# Patient Record
Sex: Male | Born: 1975 | Race: White | Hispanic: No | Marital: Married | Smoking: Former smoker
Health system: Southern US, Community
[De-identification: ages and names within clinical notes are randomized; demographics above are authoritative.]

## PROBLEM LIST (undated history)

## (undated) DIAGNOSIS — E039 Hypothyroidism, unspecified: Secondary | ICD-10-CM

## (undated) DIAGNOSIS — C01 Malignant neoplasm of base of tongue: Secondary | ICD-10-CM

## (undated) DIAGNOSIS — I82409 Acute embolism and thrombosis of unspecified deep veins of unspecified lower extremity: Secondary | ICD-10-CM

## (undated) DIAGNOSIS — E781 Pure hyperglyceridemia: Secondary | ICD-10-CM

## (undated) DIAGNOSIS — E291 Testicular hypofunction: Secondary | ICD-10-CM

## (undated) DIAGNOSIS — F411 Generalized anxiety disorder: Secondary | ICD-10-CM

## (undated) HISTORY — DX: Hypothyroidism, unspecified: E03.9

## (undated) HISTORY — PX: TONSILLECTOMY: SUR1361

## (undated) HISTORY — PX: LYMPH NODE DISSECTION: SHX5087

## (undated) HISTORY — DX: Malignant neoplasm of base of tongue: C01

## (undated) HISTORY — DX: Pure hyperglyceridemia: E78.1

## (undated) HISTORY — DX: Testicular hypofunction: E29.1

## (undated) HISTORY — DX: Acute embolism and thrombosis of unspecified deep veins of unspecified lower extremity: I82.409

---
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
## 2014-03-05 ENCOUNTER — Encounter: Payer: Self-pay | Admitting: Family Medicine

## 2014-03-05 ENCOUNTER — Ambulatory Visit (INDEPENDENT_AMBULATORY_CARE_PROVIDER_SITE_OTHER): Payer: 59 | Admitting: Family Medicine

## 2014-03-05 VITALS — BP 136/85 | HR 79 | Ht 74.5 in | Wt 218.0 lb

## 2014-03-05 DIAGNOSIS — Z13220 Encounter for screening for lipoid disorders: Secondary | ICD-10-CM

## 2014-03-05 DIAGNOSIS — Z23 Encounter for immunization: Secondary | ICD-10-CM

## 2014-03-05 DIAGNOSIS — Z131 Encounter for screening for diabetes mellitus: Secondary | ICD-10-CM

## 2014-03-05 DIAGNOSIS — R5381 Other malaise: Secondary | ICD-10-CM

## 2014-03-05 DIAGNOSIS — R5383 Other fatigue: Secondary | ICD-10-CM | POA: Insufficient documentation

## 2014-03-05 DIAGNOSIS — E781 Pure hyperglyceridemia: Secondary | ICD-10-CM

## 2014-03-05 DIAGNOSIS — R5382 Chronic fatigue, unspecified: Secondary | ICD-10-CM

## 2014-03-05 DIAGNOSIS — Z0000 Encounter for general adult medical examination without abnormal findings: Secondary | ICD-10-CM

## 2014-03-05 HISTORY — DX: Pure hyperglyceridemia: E78.1

## 2014-03-05 LAB — COMPLETE METABOLIC PANEL WITH GFR
ALK PHOS: 79 U/L (ref 39–117)
ALT: 60 U/L — ABNORMAL HIGH (ref 0–53)
AST: 104 U/L — ABNORMAL HIGH (ref 0–37)
Albumin: 4.4 g/dL (ref 3.5–5.2)
BUN: 15 mg/dL (ref 6–23)
CO2: 25 mEq/L (ref 19–32)
CREATININE: 1.42 mg/dL — AB (ref 0.50–1.35)
Calcium: 9.3 mg/dL (ref 8.4–10.5)
Chloride: 100 mEq/L (ref 96–112)
GFR, EST NON AFRICAN AMERICAN: 63 mL/min
GFR, Est African American: 72 mL/min
Glucose, Bld: 94 mg/dL (ref 70–99)
Potassium: 4 mEq/L (ref 3.5–5.3)
SODIUM: 137 meq/L (ref 135–145)
TOTAL PROTEIN: 7.1 g/dL (ref 6.0–8.3)
Total Bilirubin: 1 mg/dL (ref 0.2–1.2)

## 2014-03-05 LAB — LIPID PANEL
CHOL/HDL RATIO: 4.9 ratio
CHOLESTEROL: 202 mg/dL — AB (ref 0–200)
HDL: 41 mg/dL (ref >39)
LDL Cholesterol: 122 mg/dL — ABNORMAL HIGH (ref 0–99)
TRIGLYCERIDES: 194 mg/dL — AB (ref <150)
VLDL: 39 mg/dL (ref 0–40)

## 2014-03-05 LAB — BASIC METABOLIC PANEL: CREATININE: 1.4 — AB (ref 0.6–1.3)

## 2014-03-05 NOTE — Progress Notes (Signed)
CC: Leonard Jackson is a 38 y.o. male is here for Establish Care   Subjective: HPI:  Colonoscopy: No family history of colon cancer will begin screening at age 40 Prostate: Discussed screening risks/beneifts with patient during today's visit no family history of prostate cancer will begin screening at age 39  Influenza Vaccine: To receive this today Pneumovax: No current indication Td/Tdap:  Will receive Tdap today Zoster: (Start 38 yo)  Pleasant 38 year old here to establish care with the only acute complaint of fatigue.  No tobacco use nor rec drug use.  Rare alcohol use.  Review of Systems - General ROS: negative for - chills, fever, night sweats, weight gain or weight loss Ophthalmic ROS: negative for - decreased vision Psychological ROS: negative for - anxiety or depression ENT ROS: negative for - hearing change, nasal congestion, tinnitus or allergies Hematological and Lymphatic ROS: negative for - bleeding problems, bruising or swollen lymph nodes Breast ROS: negative Respiratory ROS: no cough, shortness of breath, or wheezing Cardiovascular ROS: no chest pain or dyspnea on exertion Gastrointestinal ROS: no abdominal pain, change in bowel habits, or black or bloody stools Genito-Urinary ROS: negative for - genital discharge, genital ulcers, incontinence or abnormal bleeding from genitals Musculoskeletal ROS: negative for - joint pain or muscle pain Neurological ROS: negative for - headaches or memory loss Dermatological ROS: negative for lumps, mole changes, rash and skin lesion changes  Past Medical History  Diagnosis Date  . Hypertriglyceridemia 03/05/2014    No past surgical history on file. No family history on file.  History   Social History  . Marital Status: Married    Spouse Name: N/A    Number of Children: N/A  . Years of Education: N/A   Occupational History  . Not on file.   Social History Main Topics  . Smoking status: Former Smoker    Quit date:  05/12/2012  . Smokeless tobacco: Not on file  . Alcohol Use: 3.0 oz/week    6 drink(s) per week  . Drug Use: No  . Sexual Activity: Yes    Partners: Female   Other Topics Concern  . Not on file   Social History Narrative  . No narrative on file     Objective: BP 136/85  Pulse 79  Ht 6' 2.5" (1.892 m)  Wt 218 lb (98.884 kg)  BMI 27.62 kg/m2  General: No Acute Distress HEENT: Atraumatic, normocephalic, conjunctivae normal without scleral icterus.  No nasal discharge, hearing grossly intact, TMs with good landmarks bilaterally with no middle ear abnormalities, posterior pharynx clear without oral lesions. Neck: Supple, trachea midline, no cervical nor supraclavicular adenopathy. Pulmonary: Clear to auscultation bilaterally without wheezing, rhonchi, nor rales. Cardiac: Regular rate and rhythm.  No murmurs, rubs, nor gallops. No peripheral edema.  2+ peripheral pulses bilaterally. Abdomen: Bowel sounds normal.  No masses.  Non-tender without rebound.  Negative Murphy's sign. MSK: Grossly intact, no signs of weakness.  Full strength throughout upper and lower extremities.  Full ROM in upper and lower extremities.  No midline spinal tenderness. Neuro: Gait unremarkable, CN II-XII grossly intact.  C5-C6 Reflex 2/4 Bilaterally, L4 Reflex 2/4 Bilaterally.  Cerebellar function intact. Skin: No rashes. 1 noninflamed skin tag on the back in the lumbar region Psych: Alert and oriented to person/place/time.  Thought process normal. No anxiety/depression.  Assessment & Plan: Leonard Jackson was seen today for establish care.  Diagnoses and associated orders for this visit:  Annual physical exam - Testosterone, free, total - COMPLETE METABOLIC  PANEL WITH GFR - Lipid panel  Lipid screening - Lipid panel  Diabetes mellitus screening - COMPLETE METABOLIC PANEL WITH GFR  Hypertriglyceridemia  Chronic fatigue    Healthy lifestyle interventions including but not limited to regular exercise, a  healthy low fat diet, moderation of salt intake, the dangers of tobacco/alcohol/recreational drug use, nutrition supplementation, and accident avoidance were discussed with the patient and a handout was provided for future reference. Due for routine dyslipidemia and diabetic screening Discussed typical fatigue workup he would prefer to only check testosterone at this time  Return if symptoms worsen or fail to improve.

## 2014-03-05 NOTE — Patient Instructions (Signed)
Dr. Hommel's General Advice Following Your Complete Physical Exam  The Benefits of Regular Exercise: Unless you suffer from an uncontrolled cardiovascular condition, studies strongly suggest that regular exercise and physical activity will add to both the quality and length of your life.  The World Health Organization recommends 150 minutes of moderate intensity aerobic activity every week.  This is best split over 3-4 days a week, and can be as simple as a brisk walk for just over 35 minutes "most days of the week".  This type of exercise has been shown to lower LDL-Cholesterol, lower average blood sugars, lower blood pressure, lower cardiovascular disease risk, improve memory, and increase one's overall sense of wellbeing.  The addition of anaerobic (or "strength training") exercises offers additional benefits including but not limited to increased metabolism, prevention of osteoporosis, and improved overall cholesterol levels.  How Can I Strive For A Low-Fat Diet?: Current guidelines recommend that 25-35 percent of your daily energy (food) intake should come from fats.  One might ask how can this be achieved without having to dissect each meal on a daily basis?  Switch to skim or 1% milk instead of whole milk.  Focus on lean meats such as ground turkey, fresh fish, baked chicken, and lean cuts of beef as your source of dietary protein.  Limit saturated fat consumption to less than 10% of your daily caloric intake.  Limit trans fatty acid consumption primarily by limiting synthetic trans fats such as partially hydrogenated oils (Ex: fried fast foods).  Substitute olive or vegetable oil for solid fats where possible.  Moderation of Salt Intake: Provided you don't carry a diagnosis of congestive heart failure nor renal failure, I recommend a daily allowance of no more than 2300 mg of salt (sodium).  Keeping under this daily goal is associated with a decreased risk of cardiovascular events, creeping  above it can lead to elevated blood pressures and increases your risk of cardiovascular events.  Milligrams (mg) of salt is listed on all nutrition labels, and your daily intake can add up faster than you think.  Most canned and frozen dinners can pack in over half your daily salt allowance in one meal.    Lifestyle Health Risks: Certain lifestyle choices carry specific health risks.  As you may already know, tobacco use has been associated with increasing one's risk of cardiovascular disease, pulmonary disease, numerous cancers, among many other issues.  What you may not know is that there are medications and nicotine replacement strategies that can more than double your chances of successfully quitting.  I would be thrilled to help manage your quitting strategy if you currently use tobacco products.  When it comes to alcohol use, I've yet to find an "ideal" daily allowance.  Provided an individual does not have a medical condition that is exacerbated by alcohol consumption, general guidelines determine "safe drinking" as no more than two standard drinks for a man or no more than one standard drink for a male per day.  However, much debate still exists on whether any amount of alcohol consumption is technically "safe".  My general advice, keep alcohol consumption to a minimum for general health promotion.  If you or others believe that alcohol, tobacco, or recreational drug use is interfering with your life, I would be happy to provide confidential counseling regarding treatment options.  General "Over The Counter" Nutrition Advice: Postmenopausal women should aim for a daily calcium intake of 1200 mg, however a significant portion of this might already be   provided by diets including milk, yogurt, cheese, and other dairy products.  Vitamin D has been shown to help preserve bone density, prevent fatigue, and has even been shown to help reduce falls in the elderly.  Ensuring a daily intake of 800 Units of  Vitamin D is a good place to start to enjoy the above benefits, we can easily check your Vitamin D level to see if you'd potentially benefit from supplementation beyond 800 Units a day.  Folic Acid intake should be of particular concern to women of childbearing age.  Daily consumption of 400-800 mcg of Folic Acid is recommended to minimize the chance of spinal cord defects in a fetus should pregnancy occur.    For many adults, accidents still remain one of the most common culprits when it comes to cause of death.  Some of the simplest but most effective preventitive habits you can adopt include regular seatbelt use, proper helmet use, securing firearms, and regularly testing your smoke and carbon monoxide detectors.  Sean B. Hommel DO [facility] [address], [address] [place], [place] [address] Phone: [phone]  

## 2014-03-06 ENCOUNTER — Telehealth: Payer: Self-pay | Admitting: *Deleted

## 2014-03-06 ENCOUNTER — Encounter: Payer: Self-pay | Admitting: Family Medicine

## 2014-03-06 DIAGNOSIS — E291 Testicular hypofunction: Secondary | ICD-10-CM

## 2014-03-06 DIAGNOSIS — R945 Abnormal results of liver function studies: Secondary | ICD-10-CM

## 2014-03-06 DIAGNOSIS — R7989 Other specified abnormal findings of blood chemistry: Secondary | ICD-10-CM | POA: Insufficient documentation

## 2014-03-06 HISTORY — DX: Testicular hypofunction: E29.1

## 2014-03-06 LAB — TESTOSTERONE, FREE, TOTAL, SHBG
SEX HORMONE BINDING: 22 nmol/L (ref 13–71)
TESTOSTERONE-% FREE: 2.4 % (ref 1.6–2.9)
Testosterone, Free: 70.2 pg/mL (ref 47.0–244.0)
Testosterone: 289 ng/dL — ABNORMAL LOW (ref 300–890)

## 2014-03-06 NOTE — Telephone Encounter (Signed)
Labs entered.

## 2015-03-24 ENCOUNTER — Encounter: Payer: Self-pay | Admitting: Family Medicine

## 2015-03-24 ENCOUNTER — Ambulatory Visit (INDEPENDENT_AMBULATORY_CARE_PROVIDER_SITE_OTHER): Payer: 59 | Admitting: Family Medicine

## 2015-03-24 VITALS — BP 134/87 | HR 77 | Wt 217.0 lb

## 2015-03-24 DIAGNOSIS — E785 Hyperlipidemia, unspecified: Secondary | ICD-10-CM | POA: Diagnosis not present

## 2015-03-24 DIAGNOSIS — E291 Testicular hypofunction: Secondary | ICD-10-CM

## 2015-03-24 MED ORDER — ATORVASTATIN CALCIUM 10 MG PO TABS: 10.0000 mg | ORAL_TABLET | Freq: Every day | ORAL | Status: DC

## 2015-03-24 NOTE — Progress Notes (Signed)
CC: Leonard Jackson is a 39 y.o. male is here for discuss chol   Subjective: HPI:  Last year he was found to have a low testosterone level. He's been doing some thinking and wants to know if he can start supplementation. He reports fatigue on most days of the week. Occasional erectile dysfunction. No other genitourinary complaints.  He's been having his cholesterol checked at work. This year his total cholesterol was 261, HDL of 46, LDL of 173, triglycerides of 209. He exercises most days of the week and thinks there may be some room for improvement with diet. He has no known cardiovascular disease. There's been no chest pain limb claudication or right upper quadrant pain. He had a neighbor who had a heart attack recently and he scared that he might need to have better cholesterol control.  Review Of Systems Outlined In HPI  Past Medical History  Diagnosis Date  . Hypertriglyceridemia 03/05/2014    No past surgical history on file. No family history on file.  Social History   Social History  . Marital Status: Married    Spouse Name: N/A  . Number of Children: N/A  . Years of Education: N/A   Occupational History  . Not on file.   Social History Main Topics  . Smoking status: Former Smoker    Quit date: 05/12/2012  . Smokeless tobacco: Not on file  . Alcohol Use: 3.0 oz/week    6 drink(s) per week  . Drug Use: No  . Sexual Activity:    Partners: Female   Other Topics Concern  . Not on file   Social History Narrative     Objective: BP 134/87 mmHg  Pulse 77  Wt 217 lb (98.431 kg)  Vital signs reviewed. General: Alert and Oriented, No Acute Distress HEENT: Pupils equal, round, reactive to light. Conjunctivae clear.  External ears unremarkable.  Moist mucous membranes. Lungs: Clear and comfortable work of breathing, speaking in full sentences without accessory muscle use. Cardiac: Regular rate and rhythm.  Neuro: CN II-XII grossly intact, gait normal. Extremities: No  peripheral edema.  Strong peripheral pulses.  Mental Status: No depression, anxiety, nor agitation. Logical though process. Skin: Warm and dry.  Assessment & Plan: Leonard Jackson was seen today for discuss chol.  Diagnoses and all orders for this visit:  Hypogonadism male -     PSA -     Hemoglobin -     Testosterone  Hyperlipidemia  Other orders -     atorvastatin (LIPITOR) 10 MG tablet; Take 1 tablet (10 mg total) by mouth daily.   Hypogonadism: Rechecking PSA hemoglobin and testosterone. If his testosterone level is still low and the other lab work is normal I prepared him that he can consider starting topical or injectable replacement therapy. Hyperlipidemia: chronic uncontrolled condition. Advised to at least start fish oil 1 g twice a day and minimize cholesterol in the diet. I also offered him that if he wanted to be aggressive he could start on a low dose of Lipitor. I also let him know that for his age we cannot give him an accurate 10 year risk prediction from the American Heart Association calculator.he would like to be aggressive and start on atorvastatin if it proves to be helpful with numbers without side effects.  Return in about 2 months (around 05/24/2015) for Cholesterol Recheck.

## 2015-03-25 LAB — TESTOSTERONE: TESTOSTERONE: 289 ng/dL — AB (ref 300–890)

## 2015-03-25 LAB — HEMOGLOBIN: Hemoglobin: 16.1 g/dL (ref 13.0–17.0)

## 2015-03-25 LAB — PSA: PSA: 0.65 ng/mL (ref <=4.00)

## 2015-03-26 ENCOUNTER — Telehealth: Payer: Self-pay | Admitting: Family Medicine

## 2015-03-26 MED ORDER — TESTOSTERONE 12.5 MG/ACT (1%) TD GEL: TRANSDERMAL | Status: DC

## 2015-03-26 NOTE — Telephone Encounter (Signed)
Leonard Jackson, Rx placed in in-box ready for pickup/faxing.  

## 2015-03-26 NOTE — Telephone Encounter (Signed)
rx faxed

## 2015-03-27 ENCOUNTER — Telehealth: Payer: Self-pay | Admitting: *Deleted

## 2015-03-27 NOTE — Telephone Encounter (Signed)
Pt's insurance will only cover axiron or androderm. Pharm wants to know if you can write a rx for either of the two

## 2015-03-31 MED ORDER — TESTOSTERONE 4 MG/24HR TD PT24: 1.0000 | MEDICATED_PATCH | Freq: Every day | TRANSDERMAL | Status: DC

## 2015-03-31 NOTE — Telephone Encounter (Signed)
Pt.notified

## 2015-03-31 NOTE — Telephone Encounter (Signed)
Seth Bake, New Rx for androderm in your in box.  Please ask patient to f/u with me in 2-3 months.

## 2015-04-03 ENCOUNTER — Telehealth: Payer: Self-pay | Admitting: Family Medicine

## 2015-04-03 NOTE — Telephone Encounter (Signed)
Received fax for prior authorization on Androderm sent through cover my meds waiting on authorization - CF

## 2015-04-14 NOTE — Telephone Encounter (Signed)
Resent authorization through cover my meds this time with the clinical questions waiting on authorization. - CF

## 2015-04-17 NOTE — Telephone Encounter (Signed)
Received authorization for Testosterone patch.  PA#: [employer] [phone] DD. Valid: 04/15/15-04/14/16.  Pharmacy notified, they will contact Pt when Rx is ready to be picked up.

## 2015-05-04 ENCOUNTER — Ambulatory Visit (INDEPENDENT_AMBULATORY_CARE_PROVIDER_SITE_OTHER): Payer: 59 | Admitting: Family Medicine

## 2015-05-04 ENCOUNTER — Ambulatory Visit (INDEPENDENT_AMBULATORY_CARE_PROVIDER_SITE_OTHER): Payer: 59

## 2015-05-04 ENCOUNTER — Encounter: Payer: Self-pay | Admitting: Family Medicine

## 2015-05-04 VITALS — BP 138/90 | HR 77 | Temp 98.4°F | Wt 223.0 lb

## 2015-05-04 DIAGNOSIS — J209 Acute bronchitis, unspecified: Secondary | ICD-10-CM | POA: Diagnosis not present

## 2015-05-04 MED ORDER — PREDNISONE 10 MG PO TABS: 30.0000 mg | ORAL_TABLET | Freq: Every day | ORAL | Status: DC

## 2015-05-04 MED ORDER — GUAIFENESIN-CODEINE 100-10 MG/5ML PO SOLN: 5.0000 mL | Freq: Every evening | ORAL | Status: DC | PRN

## 2015-05-04 MED ORDER — IPRATROPIUM-ALBUTEROL 0.5-2.5 (3) MG/3ML IN SOLN
3.0000 mL | Freq: Once | RESPIRATORY_TRACT | Status: AC
  Administered 2015-05-04: 3 mL via RESPIRATORY_TRACT

## 2015-05-04 MED ORDER — IPRATROPIUM BROMIDE 0.06 % NA SOLN: 2.0000 | Freq: Four times a day (QID) | NASAL | Status: DC

## 2015-05-04 NOTE — Assessment & Plan Note (Signed)
Likely bronchitis. Treat with prednisone and Atrovent nasal spray and codeine cough syrup. Chest x-ray pending.

## 2015-05-04 NOTE — Patient Instructions (Signed)
Thank you for coming in today. Call or go to the emergency room if you get worse, have trouble breathing, have chest pains, or palpitations.     Acute Bronchitis Bronchitis is inflammation of the airways that extend from the windpipe into the lungs (bronchi). The inflammation often causes mucus to develop. This leads to a cough, which is the most common symptom of bronchitis.  In acute bronchitis, the condition usually develops suddenly and goes away over time, usually in a couple weeks. Smoking, allergies, and asthma can make bronchitis worse. Repeated episodes of bronchitis may cause further lung problems.  CAUSES Acute bronchitis is most often caused by the same virus that causes a cold. The virus can spread from person to person (contagious) through coughing, sneezing, and touching contaminated objects. SIGNS AND SYMPTOMS   Cough.   Fever.   Coughing up mucus.   Body aches.   Chest congestion.   Chills.   Shortness of breath.   Sore throat.  DIAGNOSIS  Acute bronchitis is usually diagnosed through a physical exam. Your health care provider will also ask you questions about your medical history. Tests, such as chest X-rays, are sometimes done to rule out other conditions.  TREATMENT  Acute bronchitis usually goes away in a couple weeks. Oftentimes, no medical treatment is necessary. Medicines are sometimes given for relief of fever or cough. Antibiotic medicines are usually not needed but may be prescribed in certain situations. In some cases, an inhaler may be recommended to help reduce shortness of breath and control the cough. A cool mist vaporizer may also be used to help thin bronchial secretions and make it easier to clear the chest.  HOME CARE INSTRUCTIONS  Get plenty of rest.   Drink enough fluids to keep your urine clear or pale yellow (unless you have a medical condition that requires fluid restriction). Increasing fluids may help thin your respiratory  secretions (sputum) and reduce chest congestion, and it will prevent dehydration.   Take medicines only as directed by your health care provider.  If you were prescribed an antibiotic medicine, finish it all even if you start to feel better.  Avoid smoking and secondhand smoke. Exposure to cigarette smoke or irritating chemicals will make bronchitis worse. If you are a smoker, consider using nicotine gum or skin patches to help control withdrawal symptoms. Quitting smoking will help your lungs heal faster.   Reduce the chances of another bout of acute bronchitis by washing your hands frequently, avoiding people with cold symptoms, and trying not to touch your hands to your mouth, nose, or eyes.   Keep all follow-up visits as directed by your health care provider.  SEEK MEDICAL CARE IF: Your symptoms do not improve after 1 week of treatment.  SEEK IMMEDIATE MEDICAL CARE IF:  You develop an increased fever or chills.   You have chest pain.   You have severe shortness of breath.  You have bloody sputum.   You develop dehydration.  You faint or repeatedly feel like you are going to pass out.  You develop repeated vomiting.  You develop a severe headache. MAKE SURE YOU:   Understand these instructions.  Will watch your condition.  Will get help right away if you are not doing well or get worse.   This information is not intended to replace advice given to you by your health care provider. Make sure you discuss any questions you have with your health care provider.   Document Released: 08/18/2004 Document Revised: 08/01/2014   Document Reviewed: 01/01/2013 Elsevier Interactive Patient Education 2016 Elsevier Inc.  

## 2015-05-04 NOTE — Progress Notes (Signed)
Leonard Jackson is a 39 y.o. male who presents to [facility]  today for cough and congestion present for about 2 months. No fevers chills nausea vomiting or diarrhea. No trouble breathing wheezing shortness of breath chest pain or palpitations. He's tried some over-the-counter medications and some allergy medicines have helped a little bit   Past Medical History  Diagnosis Date  . Hypertriglyceridemia 03/05/2014   No past surgical history on file. Social History  Substance Use Topics  . Smoking status: Former Smoker    Quit date: 05/12/2012  . Smokeless tobacco: Not on file  . Alcohol Use: 3.0 oz/week    6 drink(s) per week   family history is not on file.  ROS as above Medications: Current Outpatient Prescriptions  Medication Sig Dispense Refill  . atorvastatin (LIPITOR) 10 MG tablet Take 1 tablet (10 mg total) by mouth daily. 90 tablet 3  . testosterone (ANDRODERM) 4 MG/24HR PT24 patch Place 1 patch onto the skin daily. 30 patch 2  . guaiFENesin-codeine 100-10 MG/5ML syrup Take 5 mLs by mouth at bedtime as needed for cough. 120 mL 0  . ipratropium (ATROVENT) 0.06 % nasal spray Place 2 sprays into both nostrils 4 (four) times daily. 15 mL 1  . predniSONE (DELTASONE) 10 MG tablet Take 3 tablets (30 mg total) by mouth daily. 15 tablet 0   No current facility-administered medications for this visit.   No Known Allergies   Exam:  BP 138/90 mmHg  Pulse 77  Temp(Src) 98.4 F (36.9 C) (Oral)  Wt 223 lb (101.152 kg) Gen: Well NAD nontoxic appearing HEENT: EOMI,  MMM clear nasal discharge. Posterior pharynx cobblestoning. Lungs: Normal work of breathing. Coarse breath sounds bilaterally Heart: RRR no MRG Abd: NABS, Soft. Nondistended, Nontender Exts: Brisk capillary refill, warm and well perfused.   Patient was given a 2.5/0.5 mg DuoNeb nebulizer treatment and feels better.  No results found for this or any previous visit (from the past 24  hour(s)). No results found.   Please see individual assessment and plan sections.

## 2015-05-04 NOTE — Addendum Note (Signed)
Addended by: Darla Lesches T on: 05/04/2015 05:08 PM   Modules accepted: Orders

## 2015-05-05 NOTE — Progress Notes (Signed)
Quick Note:  Xray normal. ______ 

## 2015-05-22 ENCOUNTER — Encounter: Payer: Self-pay | Admitting: Family Medicine

## 2015-05-22 ENCOUNTER — Ambulatory Visit (INDEPENDENT_AMBULATORY_CARE_PROVIDER_SITE_OTHER): Payer: 59 | Admitting: Family Medicine

## 2015-05-22 VITALS — BP 137/97 | HR 86 | Wt 220.0 lb

## 2015-05-22 DIAGNOSIS — E785 Hyperlipidemia, unspecified: Secondary | ICD-10-CM | POA: Diagnosis not present

## 2015-05-22 DIAGNOSIS — R05 Cough: Secondary | ICD-10-CM

## 2015-05-22 DIAGNOSIS — R7989 Other specified abnormal findings of blood chemistry: Secondary | ICD-10-CM | POA: Diagnosis not present

## 2015-05-22 DIAGNOSIS — E291 Testicular hypofunction: Secondary | ICD-10-CM

## 2015-05-22 DIAGNOSIS — R059 Cough, unspecified: Secondary | ICD-10-CM

## 2015-05-22 DIAGNOSIS — J209 Acute bronchitis, unspecified: Secondary | ICD-10-CM

## 2015-05-22 DIAGNOSIS — R945 Abnormal results of liver function studies: Principal | ICD-10-CM

## 2015-05-22 DIAGNOSIS — B009 Herpesviral infection, unspecified: Secondary | ICD-10-CM

## 2015-05-22 MED ORDER — VALACYCLOVIR HCL 1 G PO TABS: 1000.0000 mg | ORAL_TABLET | Freq: Two times a day (BID) | ORAL | Status: DC

## 2015-05-22 MED ORDER — GUAIFENESIN-CODEINE 100-10 MG/5ML PO SOLN: 5.0000 mL | Freq: Every evening | ORAL | Status: DC | PRN

## 2015-05-22 MED ORDER — PANTOPRAZOLE SODIUM 40 MG PO TBEC: 40.0000 mg | DELAYED_RELEASE_TABLET | Freq: Every day | ORAL | Status: DC

## 2015-05-22 NOTE — Progress Notes (Signed)
CC: Flavius Repsher is a 39 y.o. male is here for Mouth Lesions; Medication Questions; and Cough   Subjective: HPI:   follow-up elevated LFTs: no other quadrant pain, nausea, or abdominal pain.    Follow-up hyperlipidemia: He is taking atorvastatin on a daily basis andhe denies any myalgias.  No chest pain nor limb claudication   Follow-up hypogonadism: He's been using testosterone patches on a daily basis and denies any known side effects but is annoyed by having to replace them on a daily basis. He wants to know if there is an injection that he is a candidate for.  Complains of continued cough for the past 3 months. An x-ray was obtained recently and was unremarkable. Guaifenesin with codeine has been helpful and he wants to know if he can get a refill. Overall symptoms are improving but only barely and there still moderate in severity. Other than above nothing else next better or worse. No benefit from prednisone or over-the-counter antihistamines. He tells me he does feel the occasional reflux symptoms and his throat.. Denies productive cough, blood in sputum, and wheezing or shortness of breath  Complains of a sore on his left lower lip. He's never had this before. His wife gets these frequently. It started 2 days ago and is tender to the touch. He is using his wife's prescription of a topical antiviral medication. It does not seem to be helping. He denies mucosal lesions elsewhere.   Review Of Systems Outlined In HPI  Past Medical History  Diagnosis Date  . Hypertriglyceridemia 03/05/2014    No past surgical history on file. No family history on file.  Social History   Social History  . Marital Status: Married    Spouse Name: N/A  . Number of Children: N/A  . Years of Education: N/A   Occupational History  . Not on file.   Social History Main Topics  . Smoking status: Former Smoker    Quit date: 05/12/2012  . Smokeless tobacco: Not on file  . Alcohol Use: 3.0 oz/week    6  drink(s) per week  . Drug Use: No  . Sexual Activity:    Partners: Female   Other Topics Concern  . Not on file   Social History Narrative     Objective: BP 137/97 mmHg  Pulse 86  Wt 220 lb (99.791 kg)  General: Alert and Oriented, No Acute Distress HEENT: Pupils equal, round, reactive to light. Conjunctivae clear.  Moist mucous membranes pharynx unremarkable, 4 mm diameter erythematous clustered vesicular lesion on the right lower lateral lip. Tender to the touch Lungs: clear and comfortable work of breathing Cardiac: Regular rate and rhythm.  Extremities: No peripheral edema.  Strong peripheral pulses.  Mental Status: No depression, anxiety, nor agitation. Skin: Warm and dry.  Assessment & Plan: Raffaele was seen today for mouth lesions, medication questions and cough.  Diagnoses and all orders for this visit:  Elevated LFTs  Hyperlipidemia -     Lipid panel -     Hepatic function panel  Hypogonadism in male  Acute bronchitis, unspecified organism -     guaiFENesin-codeine 100-10 MG/5ML syrup; Take 5 mLs by mouth at bedtime as needed for cough.  Cough -     pantoprazole (PROTONIX) 40 MG tablet; Take 1 tablet (40 mg total) by mouth daily.  HSV infection -     valACYclovir (VALTREX) 1000 MG tablet; Take 1 tablet (1,000 mg total) by mouth 2 (two) times daily.   Rechecking hepatic function  panel for history of elevated LFTs in the setting of new atorvastatin for cholesterol control. Hyperlipidemia appears to be clinically controlled however due for repeat lipid panel Hypogonadism: Switching from topical preparation to injection of testosterone. His wife is an LPN and we will plan to have him do this at home with her help in the future he'll need to return for at least 1 more office visit injection Cough: Trial of Protonix to help with possibility of silent reflux causing laryngeal irritation and initiation of cough reflex HSV infection: Offered oral therapy versus  topical that he's been using, he would prefer the oral approach.  Return in about 4 weeks (around 06/19/2015).

## 2015-05-23 LAB — LIPID PANEL
CHOLESTEROL: 187 mg/dL (ref 125–200)
HDL: 48 mg/dL (ref >=40)
LDL Cholesterol: 100 mg/dL (ref <130)
TRIGLYCERIDES: 195 mg/dL — AB (ref <150)
Total CHOL/HDL Ratio: 3.9 Ratio (ref <=5.0)
VLDL: 39 mg/dL — ABNORMAL HIGH (ref <30)

## 2015-05-23 LAB — HEPATIC FUNCTION PANEL
ALBUMIN: 4.4 g/dL (ref 3.6–5.1)
ALK PHOS: 84 U/L (ref 40–115)
ALT: 70 U/L — AB (ref 9–46)
AST: 35 U/L (ref 10–40)
BILIRUBIN INDIRECT: 0.8 mg/dL (ref 0.2–1.2)
Bilirubin, Direct: 0.2 mg/dL (ref <=0.2)
TOTAL PROTEIN: 7.2 g/dL (ref 6.1–8.1)
Total Bilirubin: 1 mg/dL (ref 0.2–1.2)

## 2015-06-12 ENCOUNTER — Ambulatory Visit (INDEPENDENT_AMBULATORY_CARE_PROVIDER_SITE_OTHER): Payer: 59 | Admitting: Family Medicine

## 2015-06-12 VITALS — BP 129/85 | HR 92 | Resp 16 | Wt 224.0 lb

## 2015-06-12 DIAGNOSIS — E291 Testicular hypofunction: Secondary | ICD-10-CM

## 2015-06-12 MED ORDER — TESTOSTERONE CYPIONATE 200 MG/ML IM SOLN
250.0000 mg | INTRAMUSCULAR | Status: DC
  Administered 2015-06-12: 250 mg via INTRAMUSCULAR

## 2015-06-12 NOTE — Progress Notes (Signed)
   Subjective:    Patient ID: Leonard Jackson, male    DOB: May 23, 1976, 39 y.o.   MRN: [id number]  HPIPatient here for testosterone injection. He will be receiving it at home from his LPN wife. He denies chest pain, shortness of breath, headaches, mood changes and is a non-smoker.    Review of Systems     Objective:   Physical Exam        Assessment & Plan:  Patient tolerated injection well without complications. Gave him hand out on how to administer testosterone injections for his wife, along with how to log administrations and check for side effects. pak

## 2015-06-26 ENCOUNTER — Telehealth: Payer: Self-pay

## 2015-06-26 DIAGNOSIS — E291 Testicular hypofunction: Secondary | ICD-10-CM

## 2015-06-26 MED ORDER — AMBULATORY NON FORMULARY MEDICATION: Status: DC

## 2015-06-26 MED ORDER — TESTOSTERONE CYPIONATE 200 MG/ML IM SOLN: INTRAMUSCULAR | Status: DC

## 2015-06-26 NOTE — Telephone Encounter (Signed)
Pt would like to do his testosterone injection at home.  This is also documented in his note from 05/22/15.

## 2015-06-26 NOTE — Telephone Encounter (Signed)
[place], Rx placed in in-box ready for pickup/faxing.  

## 2015-06-29 NOTE — Telephone Encounter (Signed)
Rx faxed

## 2015-06-30 ENCOUNTER — Telehealth: Payer: Self-pay

## 2015-06-30 NOTE — Telephone Encounter (Signed)
Testosterone approved

## 2015-06-30 NOTE — Telephone Encounter (Signed)
PA for testosterone sent through covermymeds.

## 2015-09-14 ENCOUNTER — Ambulatory Visit (INDEPENDENT_AMBULATORY_CARE_PROVIDER_SITE_OTHER): Payer: 59 | Admitting: Family Medicine

## 2015-09-14 VITALS — BP 127/72 | HR 96 | Wt 218.0 lb

## 2015-09-14 DIAGNOSIS — E291 Testicular hypofunction: Secondary | ICD-10-CM

## 2015-09-14 MED ORDER — TESTOSTERONE CYPIONATE 200 MG/ML IM SOLN
250.0000 mg | Freq: Once | INTRAMUSCULAR | Status: AC
  Administered 2015-09-14: 250 mg via INTRAMUSCULAR

## 2015-09-14 NOTE — Progress Notes (Signed)
Patient came into office today for testosterone injection. Denies chest pain, shortness of breath, headaches and problems associated with taking this medication. Patient states he has had no abnornal mood swings. Patient tolerated injection in [facility] well without complications. Patient advised to schedule his next injection for 3 weeks from today. 

## 2015-10-05 ENCOUNTER — Other Ambulatory Visit: Payer: Self-pay | Admitting: Family Medicine

## 2015-10-05 ENCOUNTER — Ambulatory Visit (INDEPENDENT_AMBULATORY_CARE_PROVIDER_SITE_OTHER): Payer: 59 | Admitting: Family Medicine

## 2015-10-05 ENCOUNTER — Encounter: Payer: Self-pay | Admitting: Family Medicine

## 2015-10-05 VITALS — BP 144/96 | HR 90 | Wt 218.0 lb

## 2015-10-05 DIAGNOSIS — E291 Testicular hypofunction: Secondary | ICD-10-CM

## 2015-10-05 NOTE — Progress Notes (Signed)
CC: Leonard Jackson is a 40 y.o. male is here for Hypogonadism   Subjective: HPI:  Proposes 81ml every other week.  His wife has been administering his 1.5 mL injections every 3 weeks. He tells me that after 3 cycles of injections he noticed a huge improvement with energy, outlook on life and his libido returned. He denies any known side effects from this medication. He denies unintentional weight gain or loss. No skin complaints.   Review Of Systems Outlined In HPI  Past Medical History  Diagnosis Date  . Hypertriglyceridemia 03/05/2014    No past surgical history on file. No family history on file.  Social History   Social History  . Marital Status: Married    Spouse Name: N/A  . Number of Children: N/A  . Years of Education: N/A   Occupational History  . Not on file.   Social History Main Topics  . Smoking status: Former Smoker    Quit date: 05/12/2012  . Smokeless tobacco: Not on file  . Alcohol Use: 3.0 oz/week    6 drink(s) per week  . Drug Use: No  . Sexual Activity:    Partners: Female   Other Topics Concern  . Not on file   Social History Narrative     Objective: BP 144/96 mmHg  Pulse 90  Wt 218 lb (98.884 kg)  Vital signs reviewed. General: Alert and Oriented, No Acute Distress HEENT: Pupils equal, round, reactive to light. Conjunctivae clear.  External ears unremarkable.  Moist mucous membranes. Lungs: Clear and comfortable work of breathing, speaking in full sentences without accessory muscle use. Cardiac: Regular rate and rhythm.  Neuro: CN II-XII grossly intact, gait normal. Extremities: No peripheral edema.  Strong peripheral pulses.  Mental Status: No depression, anxiety, nor agitation. Logical though process. Skin: Warm and dry.  Assessment & Plan: Ewan was seen today for hypogonadism.  Diagnoses and all orders for this visit:  Hypogonadism in male  Hypogonadism: Symptomatically and clinically improving, checking testosterone and PSA  which was obtained earlier today just prior to his visit. I'll have updates for him and a final plan after these return.  No Follow-up on file.

## 2015-10-06 LAB — PSA: PSA: 0.76 ng/mL (ref <=4.00)

## 2015-10-06 LAB — TESTOSTERONE, FREE AND TOTAL (INCLUDES SHBG)-(MALES)
Sex Hormone Binding: 39 nmol/L (ref 10–50)
TESTOSTERONE FREE: 36.9 pg/mL — AB (ref 47.0–244.0)
TESTOSTERONE-% FREE: 1.7 % (ref 1.6–2.9)
Testosterone: 214 ng/dL — ABNORMAL LOW (ref 250–827)

## 2015-10-12 ENCOUNTER — Telehealth: Payer: Self-pay | Admitting: Family Medicine

## 2015-10-12 DIAGNOSIS — E291 Testicular hypofunction: Secondary | ICD-10-CM

## 2015-10-12 MED ORDER — TESTOSTERONE CYPIONATE 200 MG/ML IM SOLN: INTRAMUSCULAR | Status: DC

## 2015-10-12 NOTE — Telephone Encounter (Signed)
Will you please let patient know that his testosterone level was just barely in the deficient range.  His proposal of injecting 200mg  every other week is a reasonable proposal and I'll print off a new Rx for him. I'd recommend returning in 2-3 months to recheck his testosterone.

## 2015-10-12 NOTE — Telephone Encounter (Signed)
Pt notified of results

## 2015-10-21 IMAGING — CR DG CHEST 2V
2 series · 2 of 2 positions shown · non-contrast
Comparison: None.

CLINICAL DATA: Acute bronchitis.  Cough.

EXAM:
CHEST  2 VIEW

[chest pa]
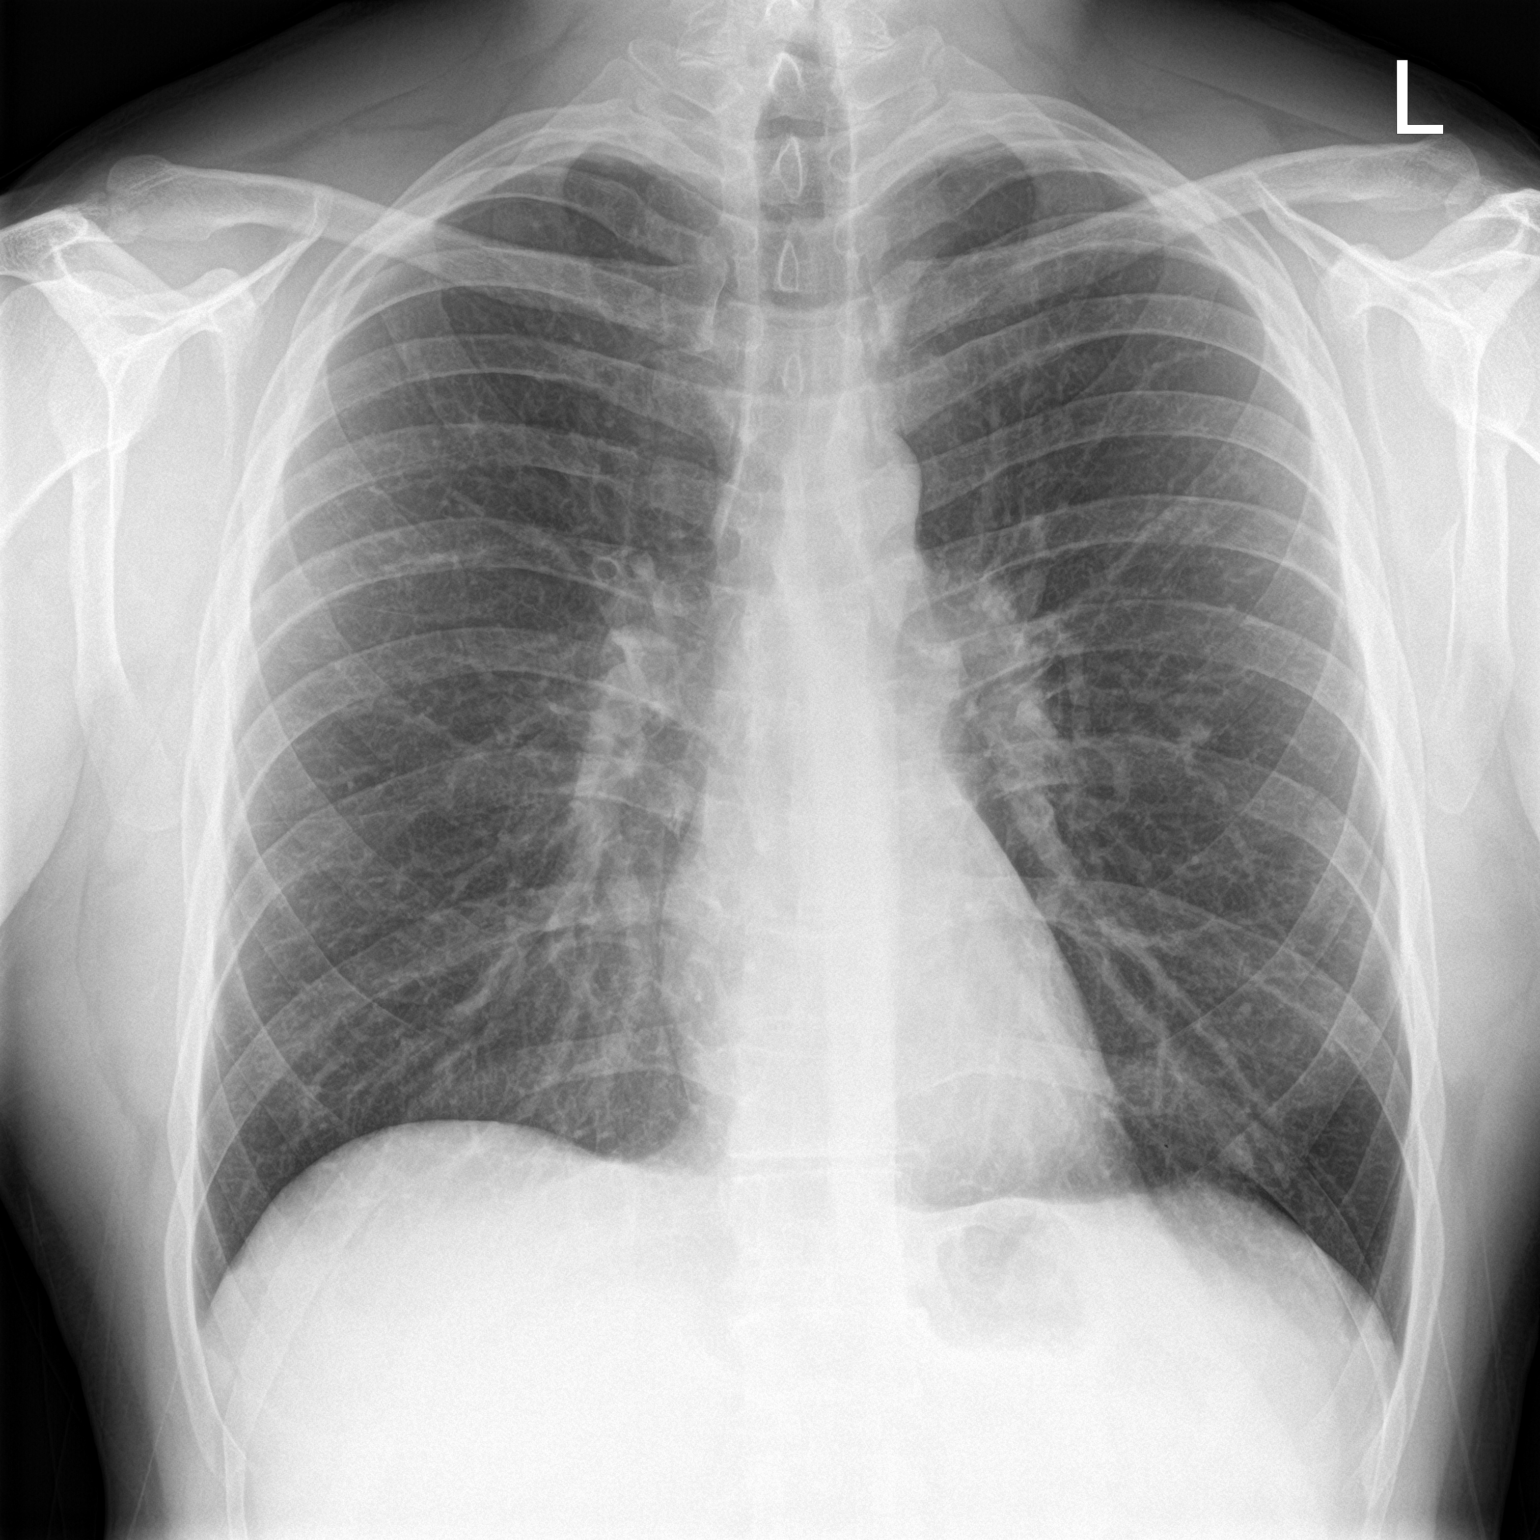

[chest lat]
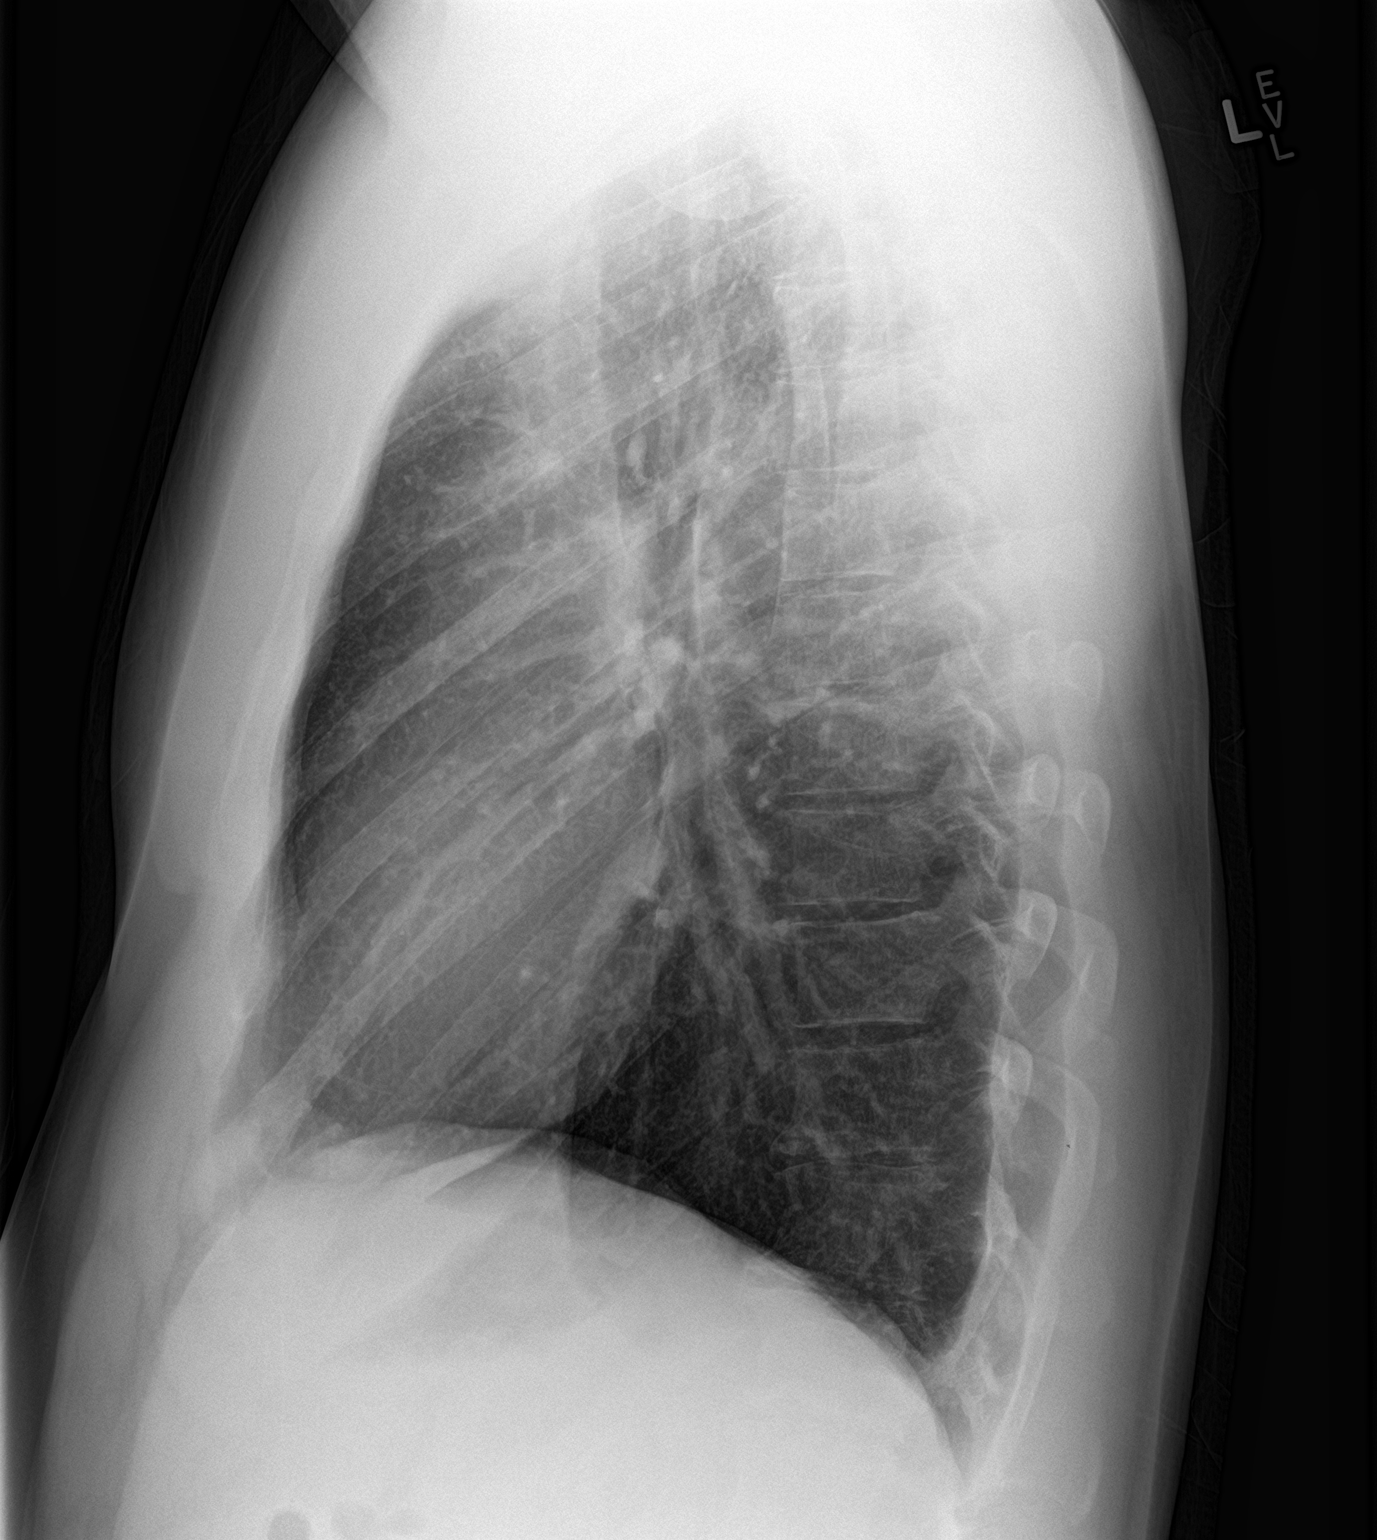

[2 of 2 positions shown; findings below may reference images not displayed]

FINDINGS: Cardiomediastinal silhouette is normal. Mediastinal contours appear
intact.

There is no evidence of focal airspace consolidation, pleural
effusion or pneumothorax.

Osseous structures are without acute abnormality. Soft tissues are
grossly normal.
IMPRESSION: No radiographic evidence of acute cardiopulmonary abnormality.

## 2015-11-03 ENCOUNTER — Telehealth: Payer: Self-pay

## 2015-11-03 DIAGNOSIS — E291 Testicular hypofunction: Secondary | ICD-10-CM

## 2015-11-03 MED ORDER — TESTOSTERONE CYPIONATE 200 MG/ML IM SOLN: INTRAMUSCULAR | Status: DC

## 2015-11-03 NOTE — Telephone Encounter (Signed)
Pt called stating that his testosterone Rx was changed and wants to know why.  He said he was getting 2 vials at a time and now he is getting 1. Please advise.

## 2015-11-03 NOTE — Telephone Encounter (Signed)
This was not intentional, I'll print off a new Rx allowing him to get two mL every month

## 2015-11-03 NOTE — Telephone Encounter (Signed)
Rx faxed

## 2015-11-23 LAB — LIPID PANEL
CHOLESTEROL: 183 mg/dL (ref 0–200)
HDL: 40 mg/dL (ref 35–70)
LDL Cholesterol: 114 mg/dL
TRIGLYCERIDES: 145 mg/dL (ref 40–160)

## 2016-01-11 ENCOUNTER — Other Ambulatory Visit: Payer: Self-pay | Admitting: Family Medicine

## 2016-01-11 DIAGNOSIS — E291 Testicular hypofunction: Secondary | ICD-10-CM

## 2016-01-12 ENCOUNTER — Encounter: Payer: Self-pay | Admitting: Family Medicine

## 2016-01-12 ENCOUNTER — Ambulatory Visit (INDEPENDENT_AMBULATORY_CARE_PROVIDER_SITE_OTHER): Payer: 59 | Admitting: Family Medicine

## 2016-01-12 VITALS — BP 142/88 | HR 80 | Wt 209.0 lb

## 2016-01-12 DIAGNOSIS — I889 Nonspecific lymphadenitis, unspecified: Secondary | ICD-10-CM

## 2016-01-12 DIAGNOSIS — E291 Testicular hypofunction: Secondary | ICD-10-CM | POA: Diagnosis not present

## 2016-01-12 LAB — TESTOSTERONE TOTAL,FREE,BIO, MALES
ALBUMIN: 4.2 g/dL (ref 3.6–5.1)
SEX HORMONE BINDING: 33 nmol/L (ref 10–50)
TESTOSTERONE BIOAVAILABLE: 75.4 ng/dL — AB (ref 130.5–681.7)
TESTOSTERONE: 303 ng/dL (ref 250–827)
Testosterone, Free: 39.2 pg/mL — ABNORMAL LOW (ref 47.0–244.0)

## 2016-01-12 MED ORDER — TESTOSTERONE CYPIONATE 200 MG/ML IM SOLN: INTRAMUSCULAR | Status: DC

## 2016-01-12 NOTE — Progress Notes (Signed)
CC: Leonard Jackson is a 40 y.o. male is here for Results   Subjective: HPI:  Follow-up hypogonadism: He's been using 1 mL of testosterone every other week. He tells me for the first week he feels fantastic, full of energy, has a satisfactory libido and mood is stable. The final week he feels the symptoms slowly subsiding to a degree where they're almost absent at the end of the week. He feels somewhat depressed at the end of this last week until he gets another injection of testosterone. He denies any skin complaints, her complaints, or any mental disturbance other than that described above. One week after his last injection he had a testosterone level drawn that was just barely above 300.  He's noticed this small lymph node on the left neck that's somewhat tender to the touch does not seem to be getting bigger and smaller and has been present for 3 days. Denies fevers, chills, sore throat, hearing loss or nasal congestion   Review Of Systems Outlined In HPI  Past Medical History  Diagnosis Date  . Hypertriglyceridemia 03/05/2014    No past surgical history on file. No family history on file.  Social History   Social History  . Marital Status: Married    Spouse Name: N/A  . Number of Children: N/A  . Years of Education: N/A   Occupational History  . Not on file.   Social History Main Topics  . Smoking status: Former Smoker    Quit date: 05/12/2012  . Smokeless tobacco: Not on file  . Alcohol Use: 3.0 oz/week    6 drink(s) per week  . Drug Use: No  . Sexual Activity:    Partners: Female   Other Topics Concern  . Not on file   Social History Narrative     Objective: BP 142/88 mmHg  Pulse 80  Wt 209 lb (94.802 kg)  General: Alert and Oriented, No Acute Distress HEENT: Pupils equal, round, reactive to light. Conjunctivae clear.  External ears unremarkable, canals clear with intact TMs with appropriate landmarks.  Middle ear appears open without effusion On the right  however mild serous effusion on the left. Pink inferior turbinates.  Moist mucous membranes, pharynx without inflammation nor lesions.  Single anterior chain lymph node swollen on the left side of the neck Lungs: Clear to auscultation bilaterally, no wheezing/ronchi/rales.  Comfortable work of breathing. Good air movement. Cardiac: Regular rate and rhythm. Normal S1/S2.  No murmurs, rubs, nor gallops.   Extremities: No peripheral edema.  Strong peripheral pulses.  Mental Status: No depression, anxiety, nor agitation. Skin: Warm and dry.  Assessment & Plan: Leonard Jackson was seen today for results.  Diagnoses and all orders for this visit:  Hypogonadism in male -     testosterone cypionate (DEPO-TESTOSTERONE) 200 MG/ML injection; 200mg  IM every week.  Lymphadenitis   Hypogonadism: Uncontrolled chronic condition, changing frequency to 200 mg every week as long as he is agreeable to return in about 2 months to recheck testosterone in symptoms. Lymphadenopathy: Possible viral upper URI given the serous effusion left ear. I'd like him to call me on Monday if the swollen lymph node is not subsiding next step would be ultrasound. Call sooner if enlarging.  25 minutes spent face-to-face during visit today of which at least 50% was counseling or coordinating care regarding: 1. Hypogonadism in male   2. Lymphadenitis      Return in about 2 months (around 03/13/2016).

## 2016-01-18 ENCOUNTER — Encounter: Payer: Self-pay | Admitting: Family Medicine

## 2016-01-18 ENCOUNTER — Telehealth: Payer: Self-pay | Admitting: Family Medicine

## 2016-01-18 ENCOUNTER — Ambulatory Visit (INDEPENDENT_AMBULATORY_CARE_PROVIDER_SITE_OTHER): Payer: 59

## 2016-01-18 ENCOUNTER — Ambulatory Visit (INDEPENDENT_AMBULATORY_CARE_PROVIDER_SITE_OTHER): Payer: 59 | Admitting: Family Medicine

## 2016-01-18 VITALS — BP 121/83 | HR 80 | Wt 208.0 lb

## 2016-01-18 DIAGNOSIS — R221 Localized swelling, mass and lump, neck: Secondary | ICD-10-CM | POA: Insufficient documentation

## 2016-01-18 LAB — TSH: TSH: 2.55 m[IU]/L (ref 0.40–4.50)

## 2016-01-18 LAB — CBC
HEMATOCRIT: 50.8 % — AB (ref 38.5–50.0)
HEMOGLOBIN: 17.4 g/dL — AB (ref 13.2–17.1)
MCH: 31.6 pg (ref 27.0–33.0)
MCHC: 34.3 g/dL (ref 32.0–36.0)
MCV: 92.2 fL (ref 80.0–100.0)
MPV: 10 fL (ref 7.5–12.5)
Platelets: 239 10*3/uL (ref 140–400)
RBC: 5.51 MIL/uL (ref 4.20–5.80)
RDW: 12.8 % (ref 11.0–15.0)
WBC: 10.2 10*3/uL (ref 3.8–10.8)

## 2016-01-18 MED ORDER — PREDNISONE 20 MG PO TABS
ORAL_TABLET | ORAL | Status: AC
Start: 2016-01-18 — End: 2016-01-23

## 2016-01-18 MED ORDER — AMOXICILLIN-POT CLAVULANATE 500-125 MG PO TABS: ORAL_TABLET | ORAL | Status: AC

## 2016-01-18 NOTE — Telephone Encounter (Signed)
Recommendations left on vm 

## 2016-01-18 NOTE — Telephone Encounter (Signed)
Will you please let patient know that his ultrasound confirms that his swelling is from a lymph node.  It does not have any dangerous features and I'd recommend he start on prednisone and an antibiotic that I sent to [facility] in hopes of shrinking it.  The radiologist has recommended a CT scan of the neck if not improving in two weeks.

## 2016-01-18 NOTE — Progress Notes (Signed)
CC: Leonard Jackson is a 40 y.o. male is here for Lymphoma   Subjective: HPI:  He thinks his left-sided neck mass has shrunken somewhat but still quite noticeable. He denies any discomfort with swallowing. He denies any ear pain, ear drainage, no decreased hearing. He denies any nasal congestion, change in voice or unintentional weight loss. No interventions other than waiting since I saw him last. Denies any fevers, chills or night sweats.   Review Of Systems Outlined In HPI  Past Medical History  Diagnosis Date  . Hypertriglyceridemia 03/05/2014    No past surgical history on file. No family history on file.  Social History   Social History  . Marital Status: Married    Spouse Name: N/A  . Number of Children: N/A  . Years of Education: N/A   Occupational History  . Not on file.   Social History Main Topics  . Smoking status: Former Smoker    Quit date: 05/12/2012  . Smokeless tobacco: Not on file  . Alcohol Use: 3.0 oz/week    6 drink(s) per week  . Drug Use: No  . Sexual Activity:    Partners: Female   Other Topics Concern  . Not on file   Social History Narrative     Objective: BP 121/83 mmHg  Pulse 80  Wt 208 lb (94.348 kg)  General: Alert and Oriented, No Acute Distress HEENT: Pupils equal, round, reactive to light. Conjunctivae clear.  External ears unremarkable, canals clear with intact TMs with appropriate landmarks.  Middle ear appears open without effusion. Pink inferior turbinates.  Moist mucous membranes, pharynx without inflammation nor lesions.  Left Sided submandibular nonpulsatile nontender mass approximately one centimeter in diameter. Lungs: Clear and comfortable work of breathing. Extremities: No peripheral edema.  Strong peripheral pulses.  Mental Status: No depression, anxiety, nor agitation. Skin: Warm and dry.  Assessment & Plan: Leonard Jackson was seen today for lymphoma.  Diagnoses and all orders for this visit:  Neck mass -     CBC -      TSH -     US Soft Tissue Head/Neck   Obtain ultrasound today for further evaluation of this mass. Checking white count and rule out rare case of this being hyperthyroidism. Thyroid nodule.  Return if symptoms worsen or fail to improve.

## 2016-01-27 ENCOUNTER — Ambulatory Visit (INDEPENDENT_AMBULATORY_CARE_PROVIDER_SITE_OTHER): Payer: 59

## 2016-01-27 ENCOUNTER — Telehealth: Payer: Self-pay | Admitting: Family Medicine

## 2016-01-27 DIAGNOSIS — L040 Acute lymphadenitis of face, head and neck: Secondary | ICD-10-CM

## 2016-01-27 DIAGNOSIS — J359 Chronic disease of tonsils and adenoids, unspecified: Secondary | ICD-10-CM | POA: Diagnosis not present

## 2016-01-27 DIAGNOSIS — R221 Localized swelling, mass and lump, neck: Secondary | ICD-10-CM

## 2016-01-27 DIAGNOSIS — R9389 Abnormal findings on diagnostic imaging of other specified body structures: Secondary | ICD-10-CM

## 2016-01-27 DIAGNOSIS — R220 Localized swelling, mass and lump, head: Principal | ICD-10-CM

## 2016-01-27 DIAGNOSIS — J358 Other chronic diseases of tonsils and adenoids: Secondary | ICD-10-CM

## 2016-01-27 NOTE — Telephone Encounter (Signed)
Patient states that the lymph node is not any better and wants to know if you could put in an order for him to have a CT scan set up.   I told him that someone in our office would be in touch with him about the order once it was sent down to Imaging.  thanks

## 2016-01-27 NOTE — Telephone Encounter (Signed)
[place], Will you please let patient know that an order for this has been placed and please give him the # for radiology so he can schedule this downstairs.

## 2016-01-27 NOTE — Telephone Encounter (Signed)
Pt advised to call imaging to set an appointment number provided on vm

## 2016-01-28 NOTE — Addendum Note (Signed)
Addended by: Beatrice Lecher D on: 01/28/2016 12:39 PM   Modules accepted: Orders

## 2016-01-29 ENCOUNTER — Telehealth: Payer: Self-pay | Admitting: Family Medicine

## 2016-01-29 NOTE — Telephone Encounter (Signed)
Pt notified.  He stated that he went to ENT this morning and they did a biopsy.

## 2016-01-29 NOTE — Telephone Encounter (Signed)
Will you please let patient know that until his tonsillar mass gets taken care of I'd recommend he hold off on getting testosterone replacement therapy.

## 2016-02-17 DIAGNOSIS — C01 Malignant neoplasm of base of tongue: Secondary | ICD-10-CM

## 2016-02-17 DIAGNOSIS — Z85810 Personal history of malignant neoplasm of tongue: Secondary | ICD-10-CM | POA: Insufficient documentation

## 2016-02-17 HISTORY — DX: Malignant neoplasm of base of tongue: C01

## 2016-02-23 ENCOUNTER — Ambulatory Visit (INDEPENDENT_AMBULATORY_CARE_PROVIDER_SITE_OTHER): Payer: 59 | Admitting: Family Medicine

## 2016-02-23 ENCOUNTER — Encounter: Payer: Self-pay | Admitting: Family Medicine

## 2016-02-23 VITALS — BP 114/77 | HR 82 | Wt 210.0 lb

## 2016-02-23 DIAGNOSIS — Z0000 Encounter for general adult medical examination without abnormal findings: Secondary | ICD-10-CM | POA: Diagnosis not present

## 2016-02-23 DIAGNOSIS — R221 Localized swelling, mass and lump, neck: Secondary | ICD-10-CM

## 2016-02-23 MED ORDER — HYDROCODONE-HOMATROPINE 5-1.5 MG/5ML PO SYRP: 5.0000 mL | ORAL_SOLUTION | Freq: Three times a day (TID) | ORAL | 0 refills | Status: DC | PRN

## 2016-02-23 NOTE — Progress Notes (Signed)
CC: Leonard Jackson is a 40 y.o. male is here for Annual Exam   Subjective: HPI:  Colonoscopy: No current indication Prostate: Discussed screening risks/beneifts with patient today, PSA up to date  Influenza Vaccine: No current indication Pneumovax: No current indication Td/Tdap: UTD Zoster: (Start 40 yo)  Requesting complete physical exam. His only complaint is a chronic cough that going on for years but worsened ever since he was diagnosed with a neck mass. He doesn't he gets a coughing fit about every 5 minutes. NyQuil has not helped, the cough is slowly worsening, he has a CT scan of the chest scheduled for next week   Review Of Systems Outlined In HPI  Past Medical History:  Diagnosis Date  . Hypertriglyceridemia 03/05/2014    No past surgical history on file. No family history on file.  Social History   Social History  . Marital status: Married    Spouse name: N/A  . Number of children: N/A  . Years of education: N/A   Occupational History  . Not on file.   Social History Main Topics  . Smoking status: Former Smoker    Quit date: 05/12/2012  . Smokeless tobacco: Not on file  . Alcohol use 3.0 oz/week    6 drink(s) per week  . Drug use: No  . Sexual activity: Yes    Partners: Female   Other Topics Concern  . Not on file   Social History Narrative  . No narrative on file     Objective: BP 114/77   Pulse 82   Wt 210 lb (95.3 kg)   BMI 26.60 kg/m   General: No Acute Distress HEENT: Atraumatic, normocephalic, conjunctivae normal without scleral icterus.  No nasal discharge, hearing grossly intact, TMs with good landmarks bilaterally with no middle ear abnormalities, posterior pharynx clear without oral lesions. Neck: Supple, trachea midline, no supraclavicular adenopathy, firm 1.5 cm painless mass in the left  submandibular space Pulmonary: Clear to auscultation bilaterally without wheezing, rhonchi, nor rales. Cardiac: Regular rate and rhythm.  No  murmurs, rubs, nor gallops. No peripheral edema.  2+ peripheral pulses bilaterally. Abdomen: Bowel sounds normal.  No masses.  Non-tender without rebound.  Negative Murphy's sign. MSK: Grossly intact, no signs of weakness.  Full strength throughout upper and lower extremities.  Full ROM in upper and lower extremities.  No midline spinal tenderness. Neuro: Gait unremarkable, CN II-XII grossly intact.  C5-C6 Reflex 2/4 Bilaterally, L4 Reflex 2/4 Bilaterally.  Cerebellar function intact. Skin: No rashes. Psych: Alert and oriented to person/place/time.  Thought process normal. No anxiety/depression.  Assessment & Plan: Leonard Jackson was seen today for annual exam.  Diagnoses and all orders for this visit:  Annual physical exam  Neck mass  Other orders -     HYDROcodone-homatropine (HYCODAN) 5-1.5 MG/5ML syrup; Take 5 mLs by mouth every 8 (eight) hours as needed for cough.   Healthy lifestyle interventions including but not limited to regular exercise, a healthy low fat diet, moderation of salt intake, the dangers of tobacco/alcohol/recreational drug use, nutrition supplementation, and accident avoidance were discussed with the patient and a handout was provided for future reference. He's had success with quitting smoking ever since his neck mass diagnosis, he believes he had his lipids checked back in April with work and he agrees to email me these results.   Unless the chest scan shows some other source of his cough it would make sense that he is getting cough due to his suspected squamous cell cancer. I'm  comfortable with him using Hycodan sparingly to help with his cough between now and the surgery.  Return in about 3 months (around 05/25/2016) for Dr. Georgina Snell Visit.  Discussed with this patient that I will be resigning from my position here with [facility] in September in order to stay with my family who will be moving to [facility]. I let him know about the providers that are still  accepting patients and I feel that this individual will be under great care if he/she stays here with [facility].

## 2016-02-24 ENCOUNTER — Telehealth: Payer: Self-pay | Admitting: Family Medicine

## 2016-02-24 MED ORDER — ATORVASTATIN CALCIUM 10 MG PO TABS: 10.0000 mg | ORAL_TABLET | Freq: Every day | ORAL | 3 refills | Status: DC

## 2016-02-24 NOTE — Telephone Encounter (Signed)
We please let patient know that I appreciate him emailing me his cholesterol numbers. It looks like the atorvastatin that he is taking is doing a great job of keeping his LDL cholesterol under control. I recommend continue taking 10 mg daily and I will [facility] in one [facility]'s worth [facility]ills [facility].

## 2016-02-24 NOTE — Telephone Encounter (Signed)
Pt.notified

## 2016-03-07 ENCOUNTER — Other Ambulatory Visit: Payer: Self-pay

## 2016-03-07 MED ORDER — ATORVASTATIN CALCIUM 10 MG PO TABS: 10.0000 mg | ORAL_TABLET | Freq: Every day | ORAL | 3 refills | Status: DC

## 2016-03-15 ENCOUNTER — Encounter: Payer: Self-pay | Admitting: Family Medicine

## 2016-06-21 DIAGNOSIS — I82409 Acute embolism and thrombosis of unspecified deep veins of unspecified lower extremity: Secondary | ICD-10-CM

## 2016-06-21 HISTORY — DX: Acute embolism and thrombosis of unspecified deep veins of unspecified lower extremity: I82.409

## 2016-07-12 ENCOUNTER — Other Ambulatory Visit: Payer: Self-pay | Admitting: Family Medicine

## 2016-07-12 DIAGNOSIS — R7989 Other specified abnormal findings of blood chemistry: Secondary | ICD-10-CM

## 2016-07-13 ENCOUNTER — Encounter: Payer: Self-pay | Admitting: Family Medicine

## 2016-07-13 ENCOUNTER — Ambulatory Visit (INDEPENDENT_AMBULATORY_CARE_PROVIDER_SITE_OTHER): Payer: 59 | Admitting: Family Medicine

## 2016-07-13 VITALS — BP 119/75 | HR 96 | Ht 75.0 in | Wt 181.0 lb

## 2016-07-13 DIAGNOSIS — R945 Abnormal results of liver function studies: Secondary | ICD-10-CM

## 2016-07-13 DIAGNOSIS — I82401 Acute embolism and thrombosis of unspecified deep veins of right lower extremity: Secondary | ICD-10-CM

## 2016-07-13 DIAGNOSIS — R7989 Other specified abnormal findings of blood chemistry: Secondary | ICD-10-CM | POA: Diagnosis not present

## 2016-07-13 DIAGNOSIS — R29898 Other symptoms and signs involving the musculoskeletal system: Secondary | ICD-10-CM

## 2016-07-13 DIAGNOSIS — E291 Testicular hypofunction: Secondary | ICD-10-CM | POA: Diagnosis not present

## 2016-07-13 DIAGNOSIS — C01 Malignant neoplasm of base of tongue: Secondary | ICD-10-CM

## 2016-07-13 DIAGNOSIS — E782 Mixed hyperlipidemia: Secondary | ICD-10-CM

## 2016-07-13 LAB — TESTOSTERONE: Testosterone: 1231 ng/dL — ABNORMAL HIGH (ref 250–827)

## 2016-07-13 MED ORDER — LORAZEPAM 0.5 MG PO TABS: 0.5000 mg | ORAL_TABLET | Freq: Three times a day (TID) | ORAL | 1 refills | Status: DC | PRN

## 2016-07-13 MED ORDER — TESTOSTERONE CYPIONATE 200 MG/ML IM SOLN: 200.0000 mg | INTRAMUSCULAR | 1 refills | Status: DC

## 2016-07-13 NOTE — Progress Notes (Signed)
Leonard Jackson is a 40 y.o. male who presents to [facility] today for establish care and discuss low testosterone, anxiety, cholesterol, and history of squamous cell cancer.His previous primary care provider has left the practice. This is his first visit with me today.  Low testosterone: Patient is a history of low testosterone and typically takes 200 mg of testosterone every week. 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 He is finished with his surgery x-ray therapy and chemotherapy and feels a lot better. He started taking testosterone again recently. He took extra testosterone (400 mg) IM Sunday and had his testosterone level checked Tuesday. The testosterone level was elevated. He feels fine with no noticeable effects.  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 He's had radical lymph node dissection as well as x-ray therapy and chemotherapy and currently has a clean bill of health. He's feeling great and states that he has a good prognosis from his oncologists.   However his cancer course was complicated by a provoked DVT. He has finished with Lovenox therapy and feels well. The DVT  involved his right leg.  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 He is due to start physical therapy next week.  Other cholesterol: Patient is a history of hypercholesterolemia. He continues to take Lipitor. He denies any significant muscle pains or aches.    Past Medical History:  Diagnosis Date  . Hypertriglyceridemia 03/05/2014   No past surgical history on file. Social History    Substance Use Topics  . Smoking status: Former Smoker    Quit date: 05/12/2012  . Smokeless tobacco: Not on file  . Alcohol use 3.0 oz/week    6 drink(s) per week   family history is not on file.  ROS as above:  Medications: Current Outpatient Prescriptions  Medication Sig Dispense Refill  . atorvastatin (LIPITOR) 10 MG tablet Take 1 tablet (10 mg total) by mouth daily. 90 tablet 3  . LORazepam (ATIVAN) 0.5 MG tablet Take 1 tablet (0.5 mg total) by mouth every 8 (eight) hours as needed for anxiety. 30 tablet 1  . testosterone cypionate (DEPOTESTOSTERONE CYPIONATE) 200 MG/ML injection Inject 1 mL (200 mg total) into the muscle once a week. 10 mL 1   No current facility-administered medications for this visit.    No Known Allergies  Health Maintenance Health Maintenance  Topic Date Due  . HIV Screening  05/04/1991  . INFLUENZA VACCINE  07/13/2017 (Originally 02/23/2016)  . TETANUS/TDAP  03/05/2024     Exam:  BP 119/75   Pulse 96   Ht 6\' 3"  (1.905 m)   Wt 181 lb (82.1 kg)   BMI 22.62 kg/m  Gen: Well NAD HEENT: EOMI,  MMM Well-appearing scar left neck. Lungs: Normal work of breathing. CTABL Heart: RRR no MRG Abd: NABS, Soft. Nondistended, Nontender Exts: Brisk capillary refill, warm and well perfused.  MSK: Left shoulder weakness to abduction normal range of motion   Results for orders placed or performed in visit on 07/12/16 (from the past 72 hour(s))  Testosterone     Status: Abnormal   Collection Time: 07/12/16  4:30 PM  Result Value Ref Range  Testosterone 1,231 (H) 250 - 827 ng/dL   No results found.    Assessment and Plan: 40 y.o. male with  Low testosterone: I think today's significantly elevated testosterone level as a result of testing to soon after getting IM testosterone and giving too much iron testosterone. Recommend decreasing to 200 mg every week and rechecking testosterone levels in about 2 months. Additionally will check PSA CMP and  CBC.  Elevated cholesterol: We'll check lipid panel at that Laboratory assessment.  History of head and neck cancer: Doing well continue management with oncology.  Left shoulder weakness did actually nerve injury. Continue physical therapy. We'll continue to recheck as needed.  History of DVT due to immobilization and chemotherapy. Continue to monitor. Doing well.    Orders Placed This Encounter  Procedures  . CBC  . COMPLETE METABOLIC PANEL WITH GFR  . Lipid panel  . Testosterone  . PSA  . CBC  . Lipid panel  . PSA  . Testosterone  . Comprehensive metabolic panel    Discussed warning signs or symptoms. Please see discharge instructions. Patient expresses understanding.   I spent 40 minutes with this patient, greater than 50% was face-to-face time counseling regarding the above diagnosis.

## 2016-07-13 NOTE — Patient Instructions (Signed)
Thank you for coming in today. Revert back to 200mg  weekly of testosterone.  Get fasting labs in about 2 months and follow up with me a week later.  Let me know if you want to do PT here in [place].   Return in 2 months or so.

## 2016-07-15 IMAGING — CT CT NECK W/ CM
4 of 5 series · 14 of 33 positions shown, 16 images · IV contrast (isovue)
Comparison: Ultrasound 01/18/2016

CLINICAL DATA: Swollen lymph node on the left side of the neck, 3
weeks duration. Marked by overlying skin marker.

EXAM:
CT NECK WITH CONTRAST
TECHNIQUE: Multidetector CT imaging of the neck was performed using the
standard protocol following the bolus administration of intravenous
contrast.
CONTRAST:  75 cc Isovue 370

[Series 2: axial neck · axial · 0.47mm/px · z∈[-231,-75]mm · 4 of 132 slices shown, 5 images]
[im 27/132  soft-tissue]
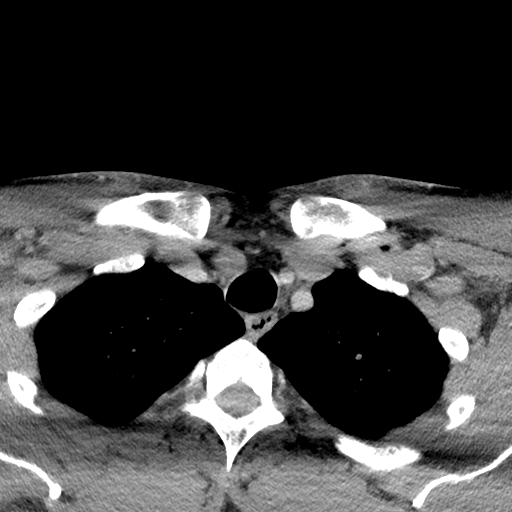
[im 27/132  bone]
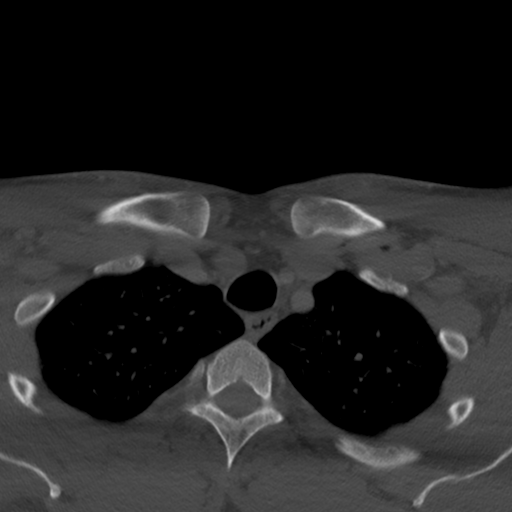
[im 53/132  bone]
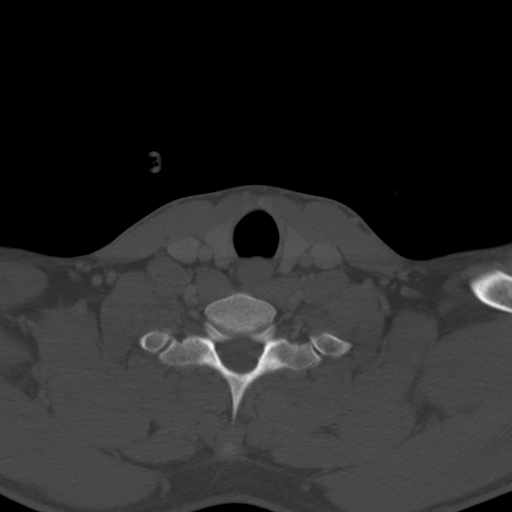
[im 79/132  bone]
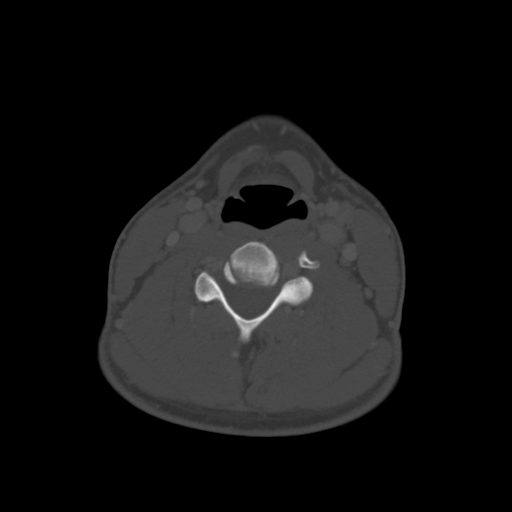
[im 105/132  bone]
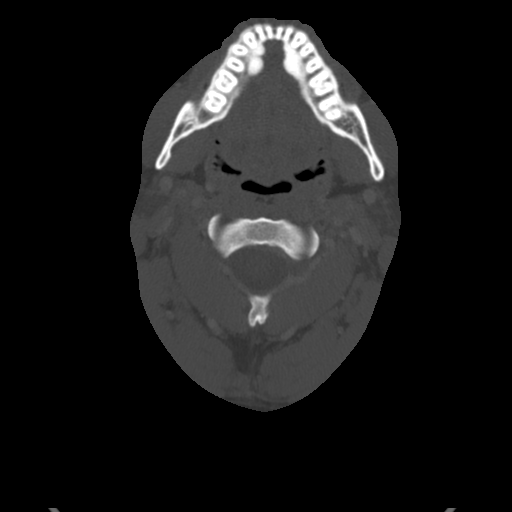

[Series 4: sag neck · sagittal · 0.46mm/px · 5 of 97 slices shown, 6 images]
[im 33/97  bone]
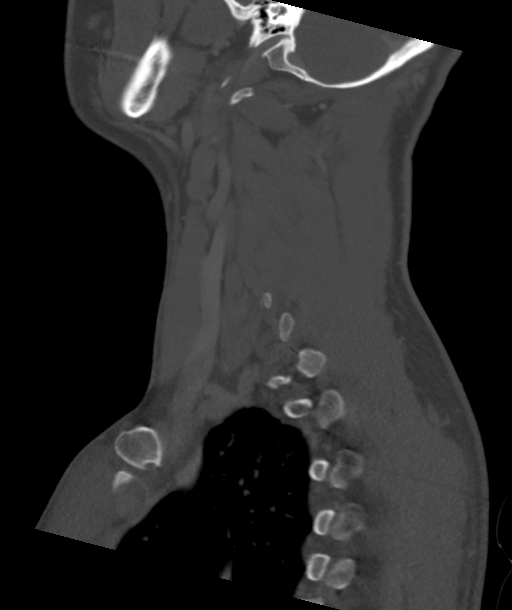
[im 41/97  bone]
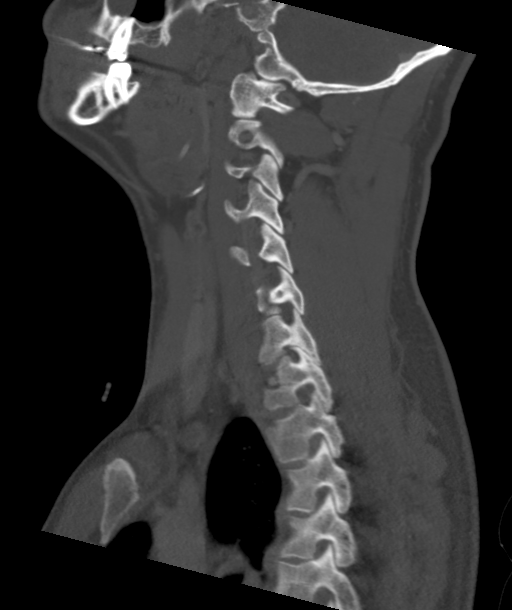
[im 49/97  soft-tissue]
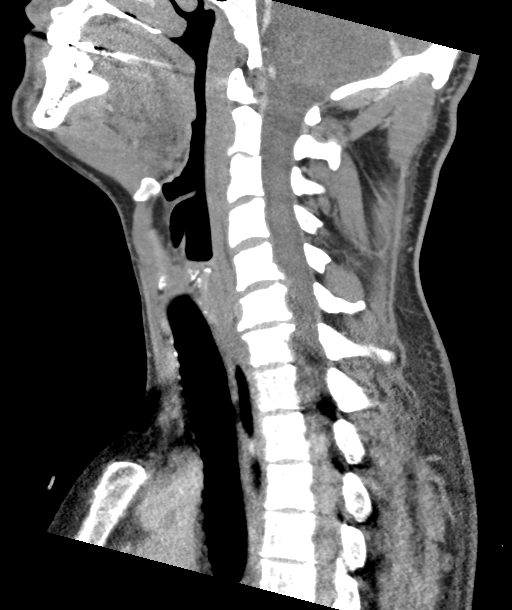
[im 49/97  bone]
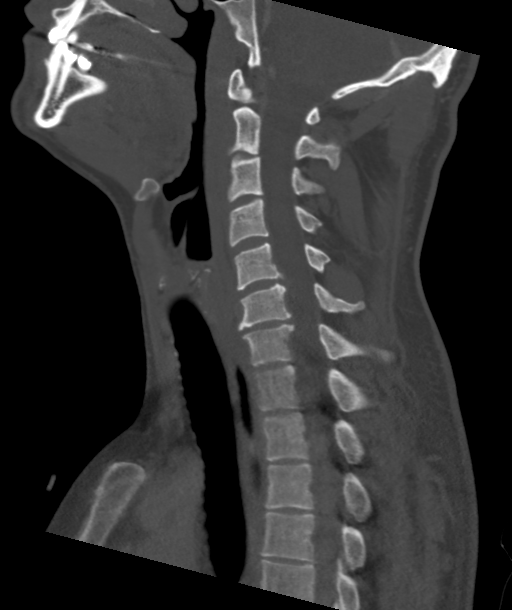
[im 57/97  bone]
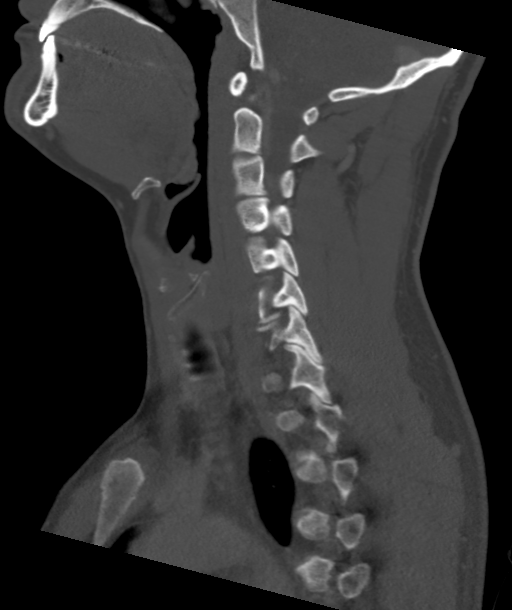
[im 65/97  bone]
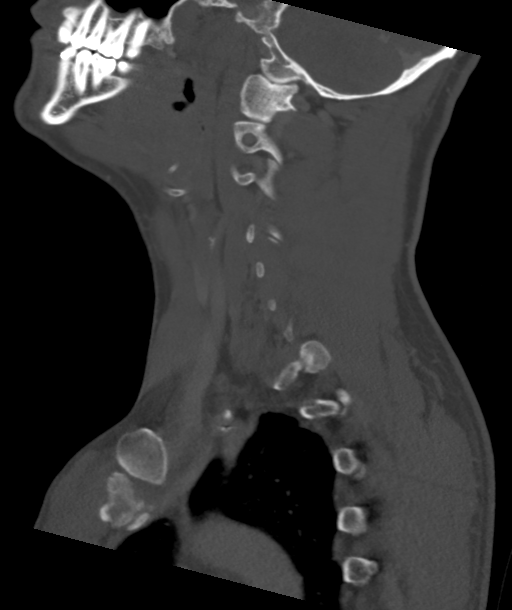

[Series 5: cor neck · coronal · 0.36mm/px · 3 of 84 slices shown]
[im 17/84  bone]
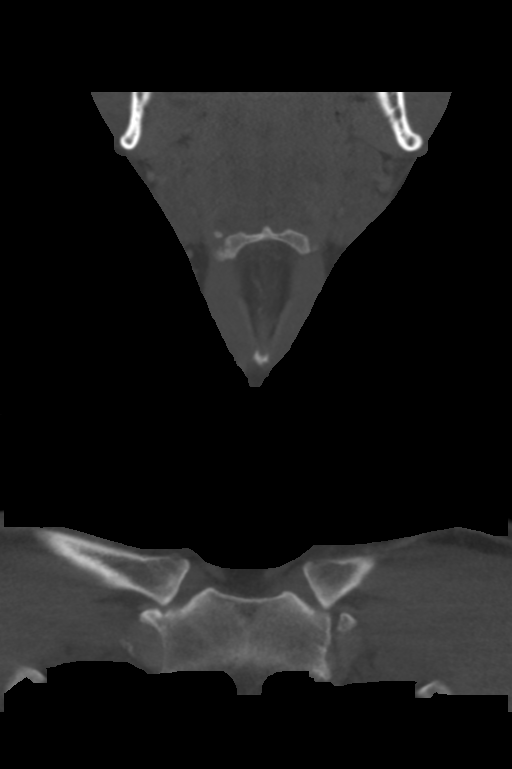
[im 34/84  bone]
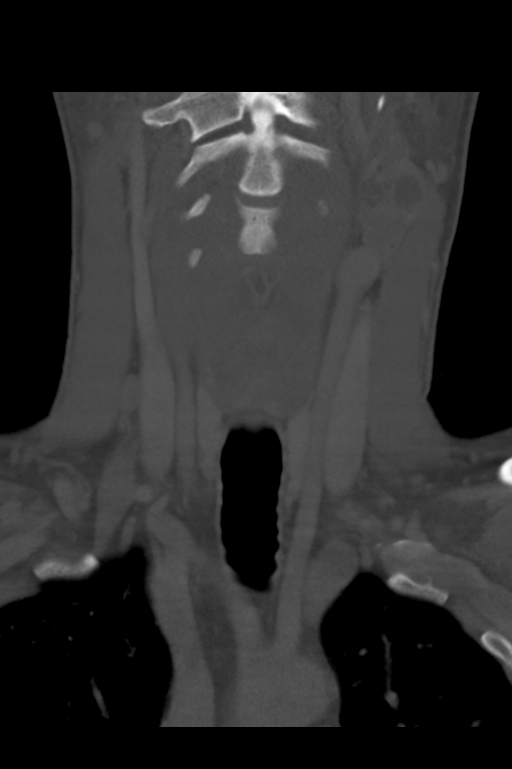
[im 50/84  bone]
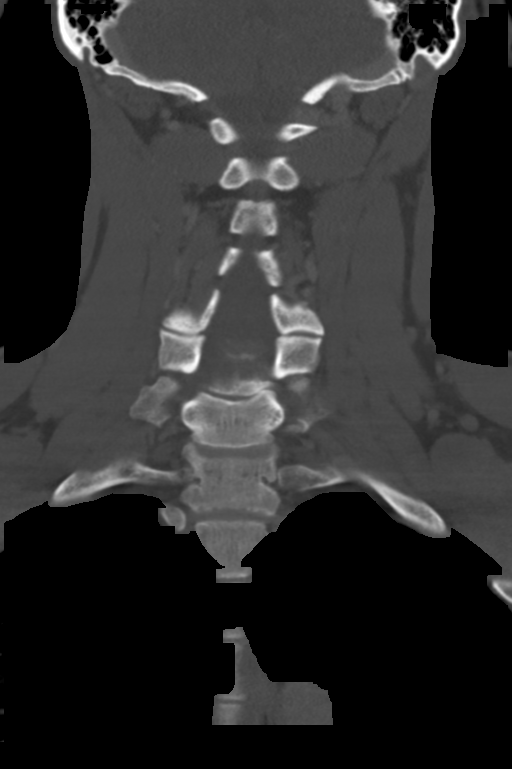

[Series 6: orthogonal ax · axial · 0.39mm/px · z∈[-263,-209]mm · 2 of 140 slices shown]
[im 28/140  bone]
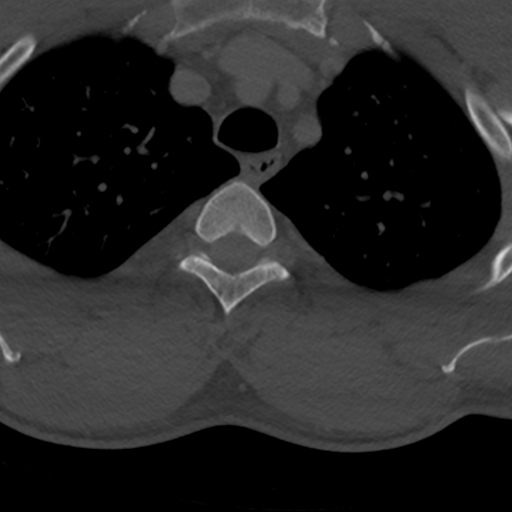
[im 56/140  bone]
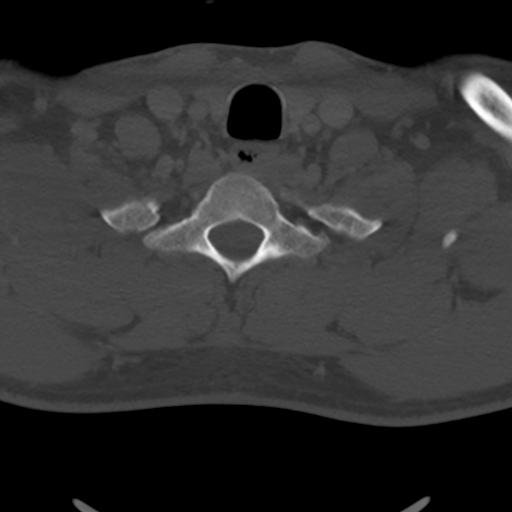

[14 of 33 positions shown; findings below may reference images not displayed]

FINDINGS: Pharynx and larynx: Asymmetric soft tissue density in the region of
the tonsil on the left worrisome for tonsillar carcinoma. Region
measures approximately 2 cm in diameter. No other region of
suspicious mucosal or submucosal disease.

Salivary glands: Submandibular and parotid glands are normal.

Thyroid: Normal

Lymph nodes: Centrally necrotic level [DATE] lymph node on the left
measuring 3 cm in diameter with a length of 3.5 cm. Highly likely to
be involved by metastatic disease. 1 x 1.5 cm level 3 node just
inferior to that also likely involved. No other pathologically
enlarged or low-density nodes on the left side of the neck. No
abnormal nodes on the right side of the neck.

Vascular: Arterial and venous structures are patent and normal.

Limited intracranial: Normal

Visualized orbits: Not included

Mastoids and visualized paranasal sinuses: Clear except for a few
insignificant retention cysts.

Skeleton: Ordinary cervical spondylosis.

Upper chest: Clear
IMPRESSION: Approximately 2 cm by 3.5 cm left tonsillar mass consistent with
tonsillar carcinoma.

Metastatic level [DATE] lymph node on the left measuring 3.5 by 3 x 3
cm with central necrosis. Probably abnormal level 3 node immediately
inferior to that likely involved.

## 2016-09-14 ENCOUNTER — Ambulatory Visit: Payer: 59 | Admitting: Family Medicine

## 2016-09-20 ENCOUNTER — Encounter: Payer: Self-pay | Admitting: Family Medicine

## 2016-09-20 LAB — HEPATIC FUNCTION PANEL
ALK PHOS: 75 U/L (ref 25–125)
ALT: 15 U/L (ref 10–40)
AST: 23 U/L (ref 14–40)
Bilirubin, Total: 0.5 mg/dL

## 2016-09-20 LAB — TESTOSTERONE: TESTOSTERONE: 480

## 2016-09-20 LAB — CBC AND DIFFERENTIAL
HCT: 49 % (ref 41–53)
HEMOGLOBIN: 16.2 g/dL (ref 13.5–17.5)
PLATELETS: 205 10*3/uL (ref 150–399)
WBC: 5 10*3/mL

## 2016-09-20 LAB — PSA: Prostate Specific Ag, Serum: 0.7

## 2016-09-20 LAB — BASIC METABOLIC PANEL
BUN: 12 mg/dL (ref 4–21)
Creatinine: 1.2 mg/dL (ref 0.6–1.3)
Glucose: 95 mg/dL
Potassium: 4.5 mmol/L (ref 3.4–5.3)
Sodium: 136 mmol/L — AB (ref 137–147)

## 2016-09-20 LAB — LIPID PANEL
CHOLESTEROL: 161 mg/dL (ref 0–200)
HDL: 54 mg/dL (ref 35–70)
LDL Cholesterol: 91 mg/dL
TRIGLYCERIDES: 78 mg/dL (ref 40–160)

## 2016-09-21 ENCOUNTER — Encounter: Payer: Self-pay | Admitting: Family Medicine

## 2016-09-21 ENCOUNTER — Ambulatory Visit (INDEPENDENT_AMBULATORY_CARE_PROVIDER_SITE_OTHER): Payer: 59 | Admitting: Family Medicine

## 2016-09-21 VITALS — BP 129/86 | HR 72 | Wt 182.0 lb

## 2016-09-21 DIAGNOSIS — R945 Abnormal results of liver function studies: Secondary | ICD-10-CM

## 2016-09-21 DIAGNOSIS — Z0000 Encounter for general adult medical examination without abnormal findings: Secondary | ICD-10-CM | POA: Diagnosis not present

## 2016-09-21 DIAGNOSIS — E782 Mixed hyperlipidemia: Secondary | ICD-10-CM

## 2016-09-21 DIAGNOSIS — E291 Testicular hypofunction: Secondary | ICD-10-CM

## 2016-09-21 DIAGNOSIS — R7989 Other specified abnormal findings of blood chemistry: Secondary | ICD-10-CM | POA: Diagnosis not present

## 2016-09-21 DIAGNOSIS — Z85810 Personal history of malignant neoplasm of tongue: Secondary | ICD-10-CM | POA: Diagnosis not present

## 2016-09-21 MED ORDER — TESTOSTERONE CYPIONATE 200 MG/ML IM SOLN: 200.0000 mg | INTRAMUSCULAR | 2 refills | Status: DC

## 2016-09-21 NOTE — Patient Instructions (Signed)
Thank you for coming in today. Recheck every 6 months or sooner if needed.  Before the next visit let me know ahead of time and we will do labs a week or two in advance and discuss labs during the visit.   Return sooner if needed.

## 2016-09-22 ENCOUNTER — Encounter: Payer: Self-pay | Admitting: Family Medicine

## 2016-09-22 NOTE — Progress Notes (Signed)
Leonard Jackson is a 41 y.o. male who presents to [facility] today for Well adult visit. Patient is doing very well overall. 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 He is currently in remission with a negative PET scan. He currently takes the medicines listed below and feels reasonably well with no active problems. He continues to work on left shoulder strengthening following an actual nerve injury as part of his cancer treatment. He's back to work in good spirits with no depression or significant anxiety. He's trying to resume normal health and exercise and stay fit.   Low testosterone: Patient takes 200 mg of testosterone weekly. He feels well and notes improved libido.  He denies fevers chills nausea vomiting or diarrhea.  Hyperlipidemia: Patient takes atorvastatin daily with no chest pain palpitations shortness of breath muscle pains or aches.  Elevated LFTs: Patient is doing well with no abdominal pain.  History of squamous cell carcinoma of the tongue: Doing well status post surgery and x-ray therapy and chemotherapy.  Currently in remission with a negative PET scan.   Past Medical History:  Diagnosis Date  . DVT (deep venous thrombosis) ([place]) 06/21/2016  . Hypertriglyceridemia 03/05/2014  . Hypogonadism in male 03/06/2014  . 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  Stage 4a   Past Surgical History:  Procedure Laterality Date  . LYMPH NODE DISSECTION Left    Neck  . TONSILLECTOMY     Social History  Substance Use Topics  . Smoking status: Former Smoker    Quit date: 05/12/2012  . Smokeless tobacco: Never Used  . Alcohol use 3.0 oz/week    6 Standard drinks or equivalent per week   family history  is not on file.  ROS as above:  Medications: Current Outpatient Prescriptions  Medication Sig Dispense Refill  . atorvastatin (LIPITOR) 10 MG tablet Take 1 tablet (10 mg total) by mouth daily. 90 tablet 3  . LORazepam (ATIVAN) 0.5 MG tablet Take 1 tablet (0.5 mg total) by mouth every 8 (eight) hours as needed for anxiety. 30 tablet 1  . testosterone cypionate (DEPOTESTOSTERONE CYPIONATE) 200 MG/ML injection Inject 1 mL (200 mg total) into the muscle once a week. 12 mL 2   No current facility-administered medications for this visit.    No Known Allergies  Health Maintenance Health Maintenance  Topic Date Due  . HIV Screening  05/04/1991  . TETANUS/TDAP  03/05/2024  . INFLUENZA VACCINE  Completed     Exam:  BP 129/86   Pulse 72   Wt 182 lb (82.6 kg)   BMI 22.75 kg/m  Gen: Well NAD HEENT: EOMI,  MMM Lungs: Normal work of breathing. CTABL Heart: RRR no MRG Abd: NABS, Soft. Nondistended, Nontender Exts: Brisk capillary refill, warm and well perfused.   Labs received from [facility] will be abstracted in: 09/20/2016 Creatinine 1.22 AST 23, ALT 15, bilirubin 0.5 Hemoglobin 16.2 Total cholesterol 161, triglycerides 78, HDL 54, LDL 91 PSA 0.7 Testosterone 480  No results found for this or any previous visit (from the past 49 hour(s)). No results found.    Assessment and Plan: 41 y.o. male with  Well adult. Doing well with no active problems. Continue current regimen.   Testosterone: Doing well continue current regimen.  Lipids:  Doing well continue current regimen.  History of elevated liver enzyme: Doing well continue current regimen  Squamous cell carcinoma: Doing well. Continue to monitor.    No orders of the defined types were placed in this encounter.  Meds ordered this encounter  Medications  . testosterone cypionate (DEPOTESTOSTERONE CYPIONATE) 200 MG/ML injection    Sig: Inject 1 mL (200 mg total) into the muscle once a week.    Dispense:  12 mL     Refill:  2     Discussed warning signs or symptoms. Please see discharge instructions. Patient expresses understanding.

## 2017-01-04 ENCOUNTER — Encounter: Payer: Self-pay | Admitting: Family Medicine

## 2017-01-04 ENCOUNTER — Ambulatory Visit (INDEPENDENT_AMBULATORY_CARE_PROVIDER_SITE_OTHER): Payer: 59 | Admitting: Family Medicine

## 2017-01-04 VITALS — BP 114/66 | HR 84 | Ht 75.0 in | Wt 194.0 lb

## 2017-01-04 DIAGNOSIS — E291 Testicular hypofunction: Secondary | ICD-10-CM | POA: Diagnosis not present

## 2017-01-04 DIAGNOSIS — E782 Mixed hyperlipidemia: Secondary | ICD-10-CM | POA: Diagnosis not present

## 2017-01-04 DIAGNOSIS — M62838 Other muscle spasm: Secondary | ICD-10-CM | POA: Diagnosis not present

## 2017-01-04 MED ORDER — CYCLOBENZAPRINE HCL 10 MG PO TABS: 10.0000 mg | ORAL_TABLET | Freq: Three times a day (TID) | ORAL | 0 refills | Status: DC | PRN

## 2017-01-04 NOTE — Patient Instructions (Addendum)
Thank you for coming in today. Attend PT.  Use flexeril at night as needed.  Recheck in 8-12 months for health update.  Return sooner if neck is not better.   Get labs early September.   Labs should be fasting and in the morning before 10 am.   Come back or go to the emergency room if you notice new weakness new numbness problems walking or bowel or bladder problems.

## 2017-01-04 NOTE — Progress Notes (Signed)
Leonard Jackson is a 41 y.o. male who presents to [facility] today for left lateral neck pain.   Leonard Jackson has a pertinent recent past medical history for head and neck cancer. He had a left cervical lymph node excision and lymph node dissection with radiation therapy. The radiation therapy completed in November 2017. He was left with weak left shoulder girdle. He spent quite a bit of physical therapy and has had incredible improvement in range of motion and strength of the left shoulder. He's been lifting weights and doing exercises without significant restrictions. However over the past several weeks she's been having pain and spasm along the anterior posterior left lateral neck. He notes the pain occurs with overhead motion. It also occurs when he is doing a plank type activity. He denies any swelling fevers or chills.  Additionally he is here to follow-up his testosterone. He's been taking testosterone supplementation and feeling great.   Past Medical History:  Diagnosis Date  . DVT (deep venous thrombosis) ([place]) 06/21/2016  . Hypertriglyceridemia 03/05/2014  . Hypogonadism in male 03/06/2014  . Squamous cell carcinoma of base of tongue ([name]) 02/17/2016   Surg Aug 2017 s/p XRT and chem.  Stage 4a   Past Surgical History:  Procedure Laterality Date  . LYMPH NODE DISSECTION Left    Neck  . TONSILLECTOMY     Social History  Substance Use Topics  . Smoking status: Former Smoker    Quit date: 05/12/2012  . Smokeless tobacco: Never Used  . Alcohol use 3.0 oz/week    6 Standard drinks or equivalent per week     ROS:  As above   Medications: Current Outpatient Prescriptions  Medication Sig Dispense Refill  . atorvastatin (LIPITOR) 10 MG tablet Take 1 tablet (10 mg total) by mouth daily. 90 tablet 3  . LORazepam (ATIVAN) 0.5 MG tablet Take 1 tablet (0.5 mg total) by mouth every 8 (eight) hours as needed for anxiety. 30 tablet 1  .  testosterone cypionate (DEPOTESTOSTERONE CYPIONATE) 200 MG/ML injection Inject 1 mL (200 mg total) into the muscle once a week. 12 mL 2  . cyclobenzaprine (FLEXERIL) 10 MG tablet Take 1 tablet (10 mg total) by mouth 3 (three) times daily as needed for muscle spasms. 30 tablet 0   No current facility-administered medications for this visit.    No Known Allergies   Exam:  BP 114/66   Pulse 84   Ht 6\' 3"  (1.905 m)   Wt 194 lb (88 kg)   BMI 24.25 kg/m  General: Well Developed, well nourished, and in no acute distress.  Neuro/Psych: Alert and oriented x3, extra-ocular muscles intact, able to move all 4 extremities, sensation grossly intact. Skin: Warm and dry, no rashes noted.  Respiratory: Not using accessory muscles, speaking in full sentences, trachea midline.  Cardiovascular: Pulses palpable, no extremity edema. Abdomen: Does not appear distended. MSK: Skin is well appearing overlying the left anterior neck however there is a surgical incision site. The bulk of the left sternocleidomastoid and trapezius muscles are diminished compared to the right. The cervical spine posterior is nontender to midline with normal neck motion. The trapezius and sternomastoid are mildly tender to palpation. The left sternomastoid is slightly tight is a palpable cord with neck motion or so compared to the contralateral right side. There are no enlarged nodes or swellings or masses in the area of the surgical incision or along the neck..  Shoulder motion is identical bilaterally.  Lab Results  Component Value Date   TESTOSTERONE 480 09/20/2016      Assessment and Plan: 41 y.o. male with  Left lateral neck pain due to weakness and fatigue of the left sternocleidomastoid and trapezius likely due to surgery and radiation therapy. Patient will benefit from physical therapy focused on strengthening and stretching.  Testosterone: At goal in March. Plan to recheck labs in a few months. We'll refill if labs  are good.    Orders Placed This Encounter  Procedures  . CBC  . COMPLETE METABOLIC PANEL WITH GFR  . Lipid Panel w/reflex Direct LDL  . Testosterone  . PSA  . Ambulatory referral to Physical Therapy    Referral Priority:   Routine    Referral Type:   Physical Medicine    Referral Reason:   Specialty Services Required    Requested Specialty:   Physical Therapy    Number of Visits Requested:   1   Meds ordered this encounter  Medications  . cyclobenzaprine (FLEXERIL) 10 MG tablet    Sig: Take 1 tablet (10 mg total) by mouth 3 (three) times daily as needed for muscle spasms.    Dispense:  30 tablet    Refill:  0    Discussed warning signs or symptoms. Please see discharge instructions. Patient expresses understanding.

## 2017-01-05 ENCOUNTER — Encounter: Payer: Self-pay | Admitting: Physical Therapy

## 2017-01-05 ENCOUNTER — Ambulatory Visit (INDEPENDENT_AMBULATORY_CARE_PROVIDER_SITE_OTHER): Payer: 59 | Admitting: Physical Therapy

## 2017-01-05 DIAGNOSIS — M62838 Other muscle spasm: Secondary | ICD-10-CM

## 2017-01-05 DIAGNOSIS — M542 Cervicalgia: Secondary | ICD-10-CM

## 2017-01-05 DIAGNOSIS — R293 Abnormal posture: Secondary | ICD-10-CM

## 2017-01-05 NOTE — Therapy (Signed)
[facility] [address] [place], [place], [address] Phone: [phone]   Fax:  [phone]  Physical Therapy Evaluation  Patient Details  Name: Leonard Jackson MRN: [id number] Date of Birth: 1975-12-14 Referring Provider: Dr Steva Colder  Encounter Date: 01/05/2017      PT End of Session - 01/05/17 0845    Visit Number 1   Number of Visits 6  pt will be out of town for one week on vacation   Date for PT Re-Evaluation 02/23/17   PT Start Time 0845   PT Stop Time 0949   PT Time Calculation (min) 64 min   Activity Tolerance Patient tolerated treatment well      Past Medical History:  Diagnosis Date  . DVT (deep venous thrombosis) ([place]) 06/21/2016  . Hypertriglyceridemia 03/05/2014  . Hypogonadism in male 03/06/2014  . Squamous cell carcinoma of base of tongue ([place]) 02/17/2016   Surg Aug 2017 s/p XRT and chem.  Stage 4a    Past Surgical History:  Procedure Laterality Date  . LYMPH NODE DISSECTION Left    Neck  . TONSILLECTOMY      There were no vitals filed for this visit.       Subjective Assessment - 01/05/17 0846    Subjective Pt reports he had CA in his Lt lymph nodes in his neck and tongue.  He has seen a chiropractor and massage therapist with minimal relief.  Had PT for stretngthening, and is now doing HEP on his own. Going to the gym 3-4 times a week currently. He is interested in trying DN   Pertinent History tongue/Lt neck lymphnode CA 05/2016, finished chemo/rx ~ 06/2016. Has SLP and some PT after surgery.    Diagnostic tests x-rays of neck - looks good per chiropractor   Patient Stated Goals perform planks without neck pain, and other exercises.  Get rid of cramping, loosen muscles    Currently in Pain? No/denies  feels it mainly with arms overhead activities, extended drivin.             [facility] PT Assessment - 01/05/17 0001      Assessment   Medical Diagnosis cervical muscle spasms   Referring  Provider Dr Steva Colder   Onset Date/Surgical Date 11/05/16   Hand Dominance Right   Next MD Visit PRN   Prior Therapy 06/2016     Precautions   Precautions None     Balance Screen   Has the patient fallen in the past 6 months No     Prior Function   Level of Independence Independent   Vocation Full time employment   Soil scientist, [employer] desk work   Leisure exercise, play disc     Posture/Postural Control   Posture/Postural Control Postural limitations   Postural Limitations Rounded Shoulders;Forward head     ROM / Strength   AROM / PROM / Strength AROM;Strength     AROM   AROM Assessment Site Shoulder;Cervical   Right/Left Shoulder --  bilat UEs WNL   Cervical Flexion 72, to chest   Cervical Extension WNL, 55   Cervical - Right Side Bend WNL   Cervical - Left Side Bend WNL   Cervical - Right Rotation 70   Cervical - Left Rotation 82     Strength   Overall Strength Comments mid trap 5-/5, low trap Rt WNL, Lt 4+/5   Strength Assessment Site Shoulder;Elbow   Right/Left Shoulder --  bilat WNL   Right/Left  Elbow --  bilat WNL     Flexibility   Soft Tissue Assessment /Muscle Length --  shorten pecs bilat     Palpation   Spinal mobility NA this visit   Palpation comment very tight in Lt upper trap/levator/SCM and scalenes, Some tightness on the Rt side also. Tight through bilat pecs, trigger points in Lt side.      Special Tests    Special Tests Cervical   Cervical Tests Spurling's     Spurling's   Findings Negative   Side --  bilat            Objective measurements completed on examination: See above findings.          [facility] PT Treatment/Exercise - 01/05/17 0001      Exercises   Exercises Neck     Neck Exercises: Standing   Neck Retraction 5 reps   Neck Retraction Limitations scapular retraction x 5reps, BWD shoulder rolls      Modalities   Modalities Moist Heat     Moist Heat Therapy   Number Minutes Moist  Heat 15 Minutes   Moist Heat Location Cervical  Lt SCM     Neck Exercises: Stretches   Other Neck Stretches 3 way door way stretches x 45 sec each          Trigger Point Dry Needling - 01/05/17 1134    Consent Given? Yes   Education Handout Provided Yes   Muscles Treated Upper Body Upper trapezius;Levator scapulae;Sternocleidomastoid  all Lt side   Sternocleidomastoid Response Twitch response elicited;Palpable increased muscle length   Upper Trapezius Response Palpable increased muscle length;Twitch reponse elicited   Levator Scapulae Response Palpable increased muscle length;Twitch response elicited              PT Education - 01/05/17 0959    Education provided Yes   Education Details HEP and DN   Person(s) Educated Patient   Methods Explanation;Demonstration;Handout   Comprehension Returned demonstration;Verbalized understanding             PT Long Term Goals - 01/05/17 1144      PT LONG TERM GOAL #1   Title I with HEP for posture re-ed ( 02/23/17)    Time 7   Period Weeks   Status New     PT LONG TERM GOAL #2   Title demo cervical rotation = each side without cramping in the neck ( 02/23/17)    Time 7   Period Weeks   Status New     PT LONG TERM GOAL #3   Title tolerate lifting weights and performing overhead activities without having cervical cramping ( 02/23/17)      PT LONG TERM GOAL #4   Time 7   Period Weeks   Status New     PT LONG TERM GOAL #5   Title maintain upright posture with good alignment of head/neck/shoulders without requiring VC's ( 02/23/17)    Time 7   Period Weeks   Status New     Additional Long Term Goals   Additional Long Term Goals Yes     PT LONG TERM GOAL #6   Title improve FOTO =/< 25% limited ( 02/23/17)    Period Weeks   Status New                Plan - 01/05/17 1137    Clinical Impression Statement 41 yo male present to therapy for cervical muscle spasms.  He is ~ 6  months s/p surgery for neck and tongue  cancer with chemo and radiation.  His shoulder ROM and strength have pretty much returned.  He is gong to the gym regularly.  He has slight decrease in cervical rotation, significant tighness of the muscles of the neck, mainly Lt side. The pain is causing cramping that limits some of his exercise and overhead activity.  He is very interested in trying trigger point dry needling, he has had chiropractic care and massage therapy.    History and Personal Factors relevant to plan of care: recent cancer neck Lt    Clinical Presentation Evolving   Clinical Decision Making Low   Rehab Potential Excellent   PT Frequency 1x / week   PT Duration --  7 weeks, he will be out of town for one of these   PT Treatment/Interventions Moist Heat;Dry needling;Taping;Manual techniques;Cryotherapy;Patient/family education;Passive range of motion;Neuromuscular re-education   PT Next Visit Plan asess response to DN and continue with this and manual work to neck    Consulted and Agree with Plan of Care Patient      Patient will benefit from skilled therapeutic intervention in order to improve the following deficits and impairments:  Decreased range of motion, Increased muscle spasms, Pain, Postural dysfunction  Visit Diagnosis: Cervicalgia - Plan: PT plan of care cert/re-cert  Abnormal posture - Plan: PT plan of care cert/re-cert  Other muscle spasm - Plan: PT plan of care cert/re-cert     Problem List Patient Active Problem List   Diagnosis Date Noted  . Shoulder weakness 07/13/2016  . History of tongue cancer 02/17/2016  . Hyperlipidemia 03/24/2015  . Hypogonadism in male 03/06/2014    Jeral Pinch PT  01/05/2017, 11:49 AM  [facility] [place], [place], [address] Phone: [phone]   Fax:  [phone]  Name: Leonard Jackson MRN: [id number] Date of Birth: 10/01/1975

## 2017-01-05 NOTE — Patient Instructions (Addendum)
Scapular Retraction (Standing)    With arms at sides, pinch shoulder blades together. Repeat _3-5___ times per set. Do ____ sets per session. Do __multiple__ sessions per day.   Strengthening: Shoulder Shrug (Phase 1)    Shrug shoulders up and down and backward. Repeat __3-5__ times per set. Do ____ sets per session. Do _multiple___ sessions per day.   Flexibility: Neck Retraction    Pull head straight back, keeping eyes and jaw level. Repeat _3-5___ times per set. Do ____ sets per session. Do __multiple__ sessions per day.  Scapula Adduction With Pectorals, Low   Stand in doorframe with palms against frame and arms at 45. Lean forward and squeeze shoulder blades. Hold _45__ seconds. Repeat _1__ times per session. Do _1-2__ sessions per day.  Scapula Adduction With Pectorals, Mid-Range   Stand in doorframe with palms against frame and arms at 90. Lean forward and squeeze shoulder blades. Hold _45__ seconds. Repeat _1__ times per session. Do 1-2__ sessions per day. \Scapula Adduction With Pectorals, High   Stand in doorframe with palms against frame and arms at 120. Lean forward and squeeze shoulder blades. Hold _45__ seconds. Repeat _1__ times per session. Do __1-2_ sessions per day.  Copyright  VHI. All rights reserved.    Trigger Point Dry Needling  . What is Trigger Point Dry Needling (DN)? o DN is a physical therapy technique used to treat muscle pain and dysfunction. Specifically, DN helps deactivate muscle trigger points (muscle knots).  o A thin filiform needle is used to penetrate the skin and stimulate the underlying trigger point. The goal is for a local twitch response (LTR) to occur and for the trigger point to relax. No medication of any kind is injected during the procedure.   . What Does Trigger Point Dry Needling Feel Like?  o The procedure feels different for each individual patient. Some patients report that they do not actually feel the needle  enter the skin and overall the process is not painful. Very mild bleeding may occur. However, many patients feel a deep cramping in the muscle in which the needle was inserted. This is the local twitch response.   Marland Kitchen How Will I feel after the treatment? o Soreness is normal, and the onset of soreness may not occur for a few hours. Typically this soreness does not last longer than two days.  o Bruising is uncommon, however; ice can be used to decrease any possible bruising.  o In rare cases feeling tired or nauseous after the treatment is normal. In addition, your symptoms may get worse before they get better, this period will typically not last longer than 24 hours.   . What Can I do After My Treatment? o Increase your hydration by drinking more water for the next 24 hours. o You may place ice or heat on the areas treated that have become sore, however, do not use heat on inflamed or bruised areas. Heat often brings more relief post needling. o You can continue your regular activities, but vigorous activity is not recommended initially after the treatment for 24 hours. o DN is best combined with other physical therapy such as strengthening, stretching, and other therapies.

## 2017-01-09 ENCOUNTER — Telehealth: Payer: Self-pay | Admitting: Family Medicine

## 2017-01-09 NOTE — Telephone Encounter (Signed)
Interpretation of cervical spine plain x-ray performed by chiropractor on 11/16/2016 Loss of cervical lordosis present. Degenerative changes most pronounced at C6-7 Multiple left-sided subcutaneous staples present    Lspine Xray interp: 06/12/2014  slight tilt to the right with the right pelvis slightly elevated. 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

## 2017-01-09 NOTE — Telephone Encounter (Signed)
Pt.notified

## 2017-01-13 ENCOUNTER — Ambulatory Visit (INDEPENDENT_AMBULATORY_CARE_PROVIDER_SITE_OTHER): Payer: 59 | Admitting: Physical Therapy

## 2017-01-13 ENCOUNTER — Encounter: Payer: Self-pay | Admitting: Physical Therapy

## 2017-01-13 DIAGNOSIS — M542 Cervicalgia: Secondary | ICD-10-CM

## 2017-01-13 DIAGNOSIS — M62838 Other muscle spasm: Secondary | ICD-10-CM

## 2017-01-13 DIAGNOSIS — R293 Abnormal posture: Secondary | ICD-10-CM

## 2017-01-13 NOTE — Therapy (Signed)
[place], [place], [address] Phone: [phone]   Fax:  [phone]  Physical Therapy Treatment  Patient Details  Name: Leonard Jackson MRN: [id number] Date of Birth: 1975/08/07 Referring Provider: Dr Steva Colder  Encounter Date: 01/13/2017      PT End of Session - 01/13/17 1335    Visit Number 2   Number of Visits 6   Date for PT Re-Evaluation 02/23/17   PT Start Time 2703   PT Stop Time 1418  heat at end of session   PT Time Calculation (min) 43 min   Activity Tolerance Patient tolerated treatment well      Past Medical History:  Diagnosis Date  . DVT (deep venous thrombosis) ([place]) 06/21/2016  . Hypertriglyceridemia 03/05/2014  . Hypogonadism in male 03/06/2014  . 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  Stage 4a    Past Surgical History:  Procedure Laterality Date  . LYMPH NODE DISSECTION Left    Neck  . TONSILLECTOMY      There were no vitals filed for this visit.      Subjective Assessment - 01/13/17 1335    Subjective Leonard Jackson reports he was sore the day of the last treatment, doing his stretches per HEP.  felt better the next day.    Patient Stated Goals perform planks without neck pain, and other exercises.  Get rid of cramping, loosen muscles    Currently in Pain? No/denies            [facility] PT Assessment - 01/13/17 0001      AROM   Cervical - Right Rotation 78   Cervical - Left Rotation 75                     [facility] Adult PT Treatment/Exercise - 01/13/17 0001      Modalities   Modalities Moist Heat     Moist Heat Therapy   Number Minutes Moist Heat 15 Minutes   Moist Heat Location Cervical  Lt SCM     Manual Therapy   Manual Therapy Soft tissue mobilization   Soft tissue mobilization STM to Lt upper trap, pericervical muscles.      Neck Exercises: Stretches   Upper Trapezius Stretch 20 seconds;3  reps          Trigger Point Dry Needling - 01/13/17 1347    Consent Given? Yes   Education Handout Provided No   Muscles Treated Upper Body Upper trapezius;Longissimus;Sternocleidomastoid   Sternocleidomastoid Response Palpable increased muscle length;Twitch response elicited  Lt   Upper Trapezius Response Palpable increased muscle length;Twitch reponse elicited  Lt with stim   Longissimus Response Palpable increased muscle length;Twitch response elicited  Lt J0-0 with stim                   PT Long Term Goals - 01/13/17 1412      PT LONG TERM GOAL #1   Title I with HEP for posture re-ed ( 02/23/17)    Status On-going     PT LONG TERM GOAL #2   Title demo cervical rotation = each side without cramping in the neck ( 02/23/17)    Status On-going     PT LONG TERM GOAL #3   Title tolerate lifting weights and performing overhead activities without having cervical cramping ( 02/23/17)   pt reports less cramping with lifting weights  Time 7   Period Weeks   Status On-going               Plan - 01/13/17 1411    Clinical Impression Statement Leonard Jackson has decreased visible tightness in his neck, responded well to the DN and manual therapy.  He tolerated todays treatment well also. He will be out of town next week and will return the following. No goals met only his second visit.    Rehab Potential Excellent   PT Frequency 1x / week   PT Treatment/Interventions Moist Heat;Dry needling;Taping;Manual techniques;Cryotherapy;Patient/family education;Passive range of motion;Neuromuscular re-education   PT Next Visit Plan asess response to DN and continue with this and manual work to neck    Consulted and Agree with Plan of Care Patient      Patient will benefit from skilled therapeutic intervention in order to improve the following deficits and impairments:  Decreased range of motion, Increased muscle spasms, Pain, Postural dysfunction  Visit Diagnosis: Other muscle  spasm  Abnormal posture  Cervicalgia     Problem List Patient Active Problem List   Diagnosis Date Noted  . Shoulder weakness 07/13/2016  . History of tongue cancer 02/17/2016  . Hyperlipidemia 03/24/2015  . Hypogonadism in male 03/06/2014    Leonard Jackson PT 01/13/2017, 2:14 PM  [facility] [place], [place], [address] Phone: [phone]   Fax:  [phone]  Name: Leonard Jackson MRN: [id number] Date of Birth: 20-Feb-1976

## 2017-01-27 ENCOUNTER — Encounter: Payer: 59 | Admitting: Physical Therapy

## 2017-02-21 ENCOUNTER — Ambulatory Visit (INDEPENDENT_AMBULATORY_CARE_PROVIDER_SITE_OTHER): Payer: 59 | Admitting: Physical Therapy

## 2017-02-21 DIAGNOSIS — M542 Cervicalgia: Secondary | ICD-10-CM

## 2017-02-21 DIAGNOSIS — M62838 Other muscle spasm: Secondary | ICD-10-CM

## 2017-02-21 DIAGNOSIS — R293 Abnormal posture: Secondary | ICD-10-CM | POA: Diagnosis not present

## 2017-02-21 NOTE — Patient Instructions (Signed)

## 2017-02-21 NOTE — Therapy (Signed)
[place], [place], [address] Phone: [phone]   Fax:  [phone]  Physical Therapy Treatment  Patient Details  Name: Leonard Jackson MRN: [id number] Date of Birth: 1975-08-16 Referring Provider: Dr Georgina Snell  Encounter Date: 02/21/2017      PT End of Session - 02/21/17 1018    Visit Number 3   Number of Visits 7   Date for PT Re-Evaluation 03/21/17   PT Start Time 1018   PT Stop Time 1118   PT Time Calculation (min) 60 min   Activity Tolerance Patient tolerated treatment well      Past Medical History:  Diagnosis Date  . DVT (deep venous thrombosis) ([place]) 06/21/2016  . Hypertriglyceridemia 03/05/2014  . Hypogonadism in male 03/06/2014  . 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  Stage 4a    Past Surgical History:  Procedure Laterality Date  . LYMPH NODE DISSECTION Left    Neck  . TONSILLECTOMY      There were no vitals filed for this visit.      Subjective Assessment - 02/21/17 1019    Subjective Pt reports he has been out of town a lot, still working out, full ROM and feeling well.  Has some exercises that are cramping up is Lt neck and shoulder.  He has decreased the creatine to help decrease dehydration.  Over the last week he has some increased pain in the neck.  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 Has SLP and some PT after surgery.    Patient Stated Goals perform planks without neck pain, and other exercises.  Get rid of cramping, loosen muscles    Currently in Pain? No/denies            [facility] PT Assessment - 02/21/17 0001      Assessment   Medical Diagnosis cervical muscle spasms, low back spasm   Referring Provider Dr Georgina Snell   Onset Date/Surgical Date 11/05/16   Hand Dominance Right   Next MD  Visit PRN   Prior Therapy 06/2016     Precautions   Precautions None     ROM / Strength   AROM / PROM / Strength AROM     AROM   AROM Assessment Site Shoulder;Cervical;Lumbar   Cervical Flexion WNl   Cervical Extension WNl   Cervical - Right Rotation 70   Cervical - Left Rotation 80   Lumbar Flexion WNL with Rt sided LBP   Lumbar Extension WNL   Lumbar - Right Side Bend WNL   Lumbar - Left Side Bend WNL   Lumbar - Right Rotation WNL   Lumbar - Left Rotation WNL                     [facility] Adult PT Treatment/Exercise - 02/21/17 0001      Modalities   Modalities Moist Heat     Moist Heat Therapy   Number Minutes Moist Heat 15 Minutes   Moist Heat Location Cervical     Electrical Stimulation   Electrical Stimulation Location not used due to Cancer dx     Manual Therapy   Manual Therapy Soft tissue mobilization   Soft tissue mobilization STM to Lt upper trap, pericervical muscles.   Rt gluts     Neck Exercises: Stretches  Upper Trapezius Stretch 20 seconds   Levator Stretch 20 seconds   Neck Stretch 20 seconds          Trigger Point Dry Needling - 02/21/17 1031    Consent Given? Yes   Education Handout Provided No   Muscles Treated Upper Body Upper trapezius;Oblique capitus;Suboccipitals muscle group  all left side   Muscles Treated Lower Body Gluteus maximus  rt   Upper Trapezius Response Palpable increased muscle length;Twitch reponse elicited  with stim   Oblique Capitus Response Palpable increased muscle length;Twitch response elicited   SubOccipitals Response Palpable increased muscle length;Twitch response elicited   Longissimus Response Palpable increased muscle length;Twitch response elicited  left              PT Education - 02/21/17 1125    Education provided Yes   Education Details home tens pt instructed to talk to oncology MD and see if its ok for him to use tens   Person(s) Educated Patient   Methods Explanation;Handout    Comprehension Verbalized understanding             PT Long Term Goals - 02/21/17 1023      PT LONG TERM GOAL #1   Title I with HEP for posture re-ed ( 03/21/17)    Time 4   Period Weeks   Status On-going     PT LONG TERM GOAL #2   Title demo cervical rotation = each side without cramping in the neck ( 03/21/17)    Time 4   Period Weeks   Status On-going     PT LONG TERM GOAL #3   Title tolerate lifting weights and performing overhead activities without having cervical cramping ( 03/21/17)    Time 4   Period Weeks   Status On-going  pt reports 50% improvement currently      PT LONG TERM GOAL #4   Title report decreased LBP with lifitng and flexion to his baseline  (03/21/17)    Time 4   Period Weeks   Status New     PT LONG TERM GOAL #5   Title maintain upright posture with good alignment of head/neck/shoulders without requiring VC's ( 02/23/17)    Status Achieved     PT LONG TERM GOAL #6   Title improve FOTO =/< 25% limited ( 03/21/17)    Time 4   Period Weeks   Status On-going               Plan - 02/21/17 1838    Clinical Impression Statement Leonard Jackson has not been to therapy for a month.  He had vacation and then a lot of traveling.  He states he was doing well until recently as he increased his lifting his neck pain began to return and he also developed some LBP.  He would benefit from continued treatment as he did not finish the prior plan of care and still has deficits.  Patient expressed interest in getting a home TENs unit.  The information was shared with him and he was instructed to talk to his oncology MD to see if it is safe for him to use.  He has met one goal, making progress to the others and a goal for his low back has been added.    History and Personal Factors relevant to plan of care: 11/17 neck CA   Rehab Potential Excellent   PT Frequency 1x / week   PT Duration 4 weeks   PT Treatment/Interventions Moist  Heat;Dry needling;Taping;Manual  techniques;Cryotherapy;Patient/family education;Passive range of motion;Neuromuscular re-education   PT Next Visit Plan cont DN and manual work.    Consulted and Agree with Plan of Care Patient      Patient will benefit from skilled therapeutic intervention in order to improve the following deficits and impairments:  Decreased range of motion, Increased muscle spasms, Pain, Postural dysfunction  Visit Diagnosis: Other muscle spasm - Plan: PT plan of care cert/re-cert  Abnormal posture - Plan: PT plan of care cert/re-cert  Cervicalgia - Plan: PT plan of care cert/re-cert     Problem List Patient Active Problem List   Diagnosis Date Noted  . Shoulder weakness 07/13/2016  . History of tongue cancer 02/17/2016  . Hyperlipidemia 03/24/2015  . Hypogonadism in male 03/06/2014    Jeral Pinch PT  02/21/2017, 6:44 PM  [facility] [place], [place], [address] Phone: [phone]   Fax:  [phone]  Name: Leonard Jackson MRN: [id number] Date of Birth: 09/10/1975

## 2017-03-02 ENCOUNTER — Ambulatory Visit (INDEPENDENT_AMBULATORY_CARE_PROVIDER_SITE_OTHER): Payer: 59 | Admitting: Physical Therapy

## 2017-03-02 ENCOUNTER — Encounter: Payer: Self-pay | Admitting: Physical Therapy

## 2017-03-02 DIAGNOSIS — M62838 Other muscle spasm: Secondary | ICD-10-CM

## 2017-03-02 DIAGNOSIS — M542 Cervicalgia: Secondary | ICD-10-CM | POA: Diagnosis not present

## 2017-03-02 DIAGNOSIS — R293 Abnormal posture: Secondary | ICD-10-CM | POA: Diagnosis not present

## 2017-03-02 NOTE — Patient Instructions (Addendum)
Strengthening: Resisted Diagonal    Hold tubing with right arm down across body, thumb pointing back. Pull arm up and out, rotating arm to palm forward. Repeat __10__ times per set. Do _3___ sets per session. Do _1___ sessions per day.   Strengthening: Resisted Horizontal Abduction    Hold tubing in right hand, elbow straight, arm in, parallel to floor. Pull arm out from side through pain-free range. Repeat __10__ times per set. Do __3__ sets per session. Do __5__ sessions per week.  Copyright  V[employer]. All rights reserved.

## 2017-03-02 NOTE — Therapy (Signed)
[place], [place], [address] Phone: [phone]   Fax:  [phone]  Physical Therapy Treatment  Patient Details  Name: Leonard Jackson MRN: [id number] Date of Birth: [birth year] Referring Provider: Dr Georgina Snell  Encounter Date: 03/02/2017      PT End of Session - 03/02/17 1704    Visit Number 4   Number of Visits 7   Date for PT Re-Evaluation 03/21/17   PT Start Time 0630   PT Stop Time 1800   PT Time Calculation (min) 56 min   Activity Tolerance Patient tolerated treatment well      Past Medical History:  Diagnosis Date  . DVT (deep venous thrombosis) ([place]) 06/21/2016  . Hypertriglyceridemia 03/05/2014  . Hypogonadism in male 03/06/2014  . 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  Stage 4a    Past Surgical History:  Procedure Laterality Date  . LYMPH NODE DISSECTION Left    Neck  . TONSILLECTOMY      There were no vitals filed for this visit.      Subjective Assessment - 03/02/17 1705    Subjective Leonard Jackson reports he had a slight cramp in his neck today, having them once a week compared to 5-6 times a week. He had a chair massage today and that is when is occurred.  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 Has SLP and some PT after surgery.    Patient Stated Goals perform planks without neck pain, and other exercises.  Get rid of cramping, loosen muscles    Currently in Pain? No/denies                         [facility] Adult PT Treatment/Exercise - 03/02/17 0001      Exercises   Exercises Neck     Neck Exercises: Standing   Upper Extremity D1 Flexion;Theraband  30 reps   Theraband Level (UE D1) Level 4 (Blue);Level 3 (Green)  some with blue, most with green due to cramping.   Horizontal abduction 3x10  with green band     Modalities   Modalities Moist Heat     Moist Heat Therapy   Number Minutes Moist Heat 10 Minutes   Moist Heat Location Cervical  and anterior neck     Manual Therapy   Manual Therapy Soft tissue mobilization;Joint mobilization   Joint Mobilization grade III+ mobs C2-6 Lt UPA and CPA with focus at C3   Soft tissue mobilization STM to Lt upper trap, pericervical muscles and SCM          Trigger Point Dry Needling - 03/02/17 1757    Consent Given? Yes   Education Handout Provided No   Muscles Treated Upper Body Upper trapezius  SCM Lt side with good twitch response   Upper Trapezius Response Twitch reponse elicited;Palpable increased muscle length  Lt with stim                   PT Long Term Goals - 03/02/17 1709      PT LONG TERM GOAL #1   Title I with HEP for posture re-ed ( 03/21/17)    Status On-going     PT LONG TERM GOAL #2   Title demo cervical rotation = each side without cramping in the  neck ( 03/21/17)      PT LONG TERM GOAL #3   Title tolerate lifting weights and performing overhead activities without having cervical cramping ( 03/21/17)    Status --  pt has been able to perform overhead presses, had one episode of cramping.      PT LONG TERM GOAL #4   Title report decreased LBP with lifitng and flexion to his baseline  (03/21/17)    Status Achieved  as of 03/02/17     PT LONG TERM GOAL #5   Title maintain upright posture with good alignment of head/neck/shoulders without requiring VC's ( 02/23/17)    Status Achieved     PT LONG TERM GOAL #6   Title improve FOTO =/< 25% limited ( 03/21/17)    Status On-going               Plan - 03/02/17 1759    Clinical Impression Statement Pt is making progress, having less cervical spasms and increased mobility of the cervical muscles.  He is hypomobile in the C -spine with CPA and Lt UPA mobs. REpsonded well to mobilizations   Rehab Potential Excellent   PT Frequency 1x / week   PT  Duration 6 weeks   PT Treatment/Interventions Moist Heat;Dry needling;Taping;Manual techniques;Cryotherapy;Patient/family education;Passive range of motion;Neuromuscular re-education   PT Next Visit Plan cont DN and manual work.    Consulted and Agree with Plan of Care Patient      Patient will benefit from skilled therapeutic intervention in order to improve the following deficits and impairments:  Decreased range of motion, Increased muscle spasms, Pain, Postural dysfunction  Visit Diagnosis: Other muscle spasm  Abnormal posture  Cervicalgia     Problem List Patient Active Problem List   Diagnosis Date Noted  . Shoulder weakness 07/13/2016  . History of tongue cancer 02/17/2016  . Hyperlipidemia 03/24/2015  . Hypogonadism in male 03/06/2014    Jeral Pinch PT 03/02/2017, 6:06 PM  [facility] [place], [place], [address] Phone: [phone]   Fax:  [phone]  Name: Leonard Jackson MRN: [id number] Date of Birth: Sep 30, 1975

## 2017-03-08 ENCOUNTER — Encounter: Payer: Self-pay | Admitting: Physical Therapy

## 2017-03-08 ENCOUNTER — Ambulatory Visit (INDEPENDENT_AMBULATORY_CARE_PROVIDER_SITE_OTHER): Payer: 59 | Admitting: Physical Therapy

## 2017-03-08 DIAGNOSIS — M542 Cervicalgia: Secondary | ICD-10-CM

## 2017-03-08 DIAGNOSIS — R293 Abnormal posture: Secondary | ICD-10-CM

## 2017-03-08 DIAGNOSIS — M62838 Other muscle spasm: Secondary | ICD-10-CM

## 2017-03-08 NOTE — Therapy (Signed)
[name], [place], [address] Phone: [phone]   Fax:  [phone]  Physical Therapy Treatment  Patient Details  Name: Leonard Jackson MRN: [id number] Date of Birth: 30-Sep-1975 Referring Provider: Dr Georgina Snell  Encounter Date: 03/08/2017      PT End of Session - 03/08/17 0802    Visit Number 5   Number of Visits 7   Date for PT Re-Evaluation 03/21/17   PT Start Time 0802   PT Stop Time 0848  on heat at end of session   PT Time Calculation (min) 46 min   Activity Tolerance Patient tolerated treatment well      Past Medical History:  Diagnosis Date  . DVT (deep venous thrombosis) ([place]) 06/21/2016  . Hypertriglyceridemia 03/05/2014  . Hypogonadism in male 03/06/2014  . 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  Stage 4a    Past Surgical History:  Procedure Laterality Date  . LYMPH NODE DISSECTION Left    Neck  . TONSILLECTOMY      There were no vitals filed for this visit.      Subjective Assessment - 03/08/17 0804    Subjective (P)  Pt thinks he slept funny last night because he felt great until today. now sore in the neck and low back.    Patient Stated Goals (P)  perform planks without neck pain, and other exercises.  Get rid of cramping, loosen muscles    Currently in Pain? (P)  Yes   Pain Score (P)  5    Pain Location (P)  Neck   Pain Orientation (P)  Left   Pain Type (P)  Acute pain                         [facility] Adult PT Treatment/Exercise - 03/08/17 0001      Modalities   Modalities Moist Heat     Moist Heat Therapy   Number Minutes Moist Heat 12 Minutes   Moist Heat Location Cervical     Manual Therapy   Manual Therapy Soft tissue mobilization   Soft tissue mobilization STM to Lt upper trap, pericervical muscles and SCM and rhomboids          Trigger Point Dry Needling - 03/08/17 3474    Consent  Given? Yes   Education Handout Provided No   Muscles Treated Upper Body Upper trapezius;Levator scapulae;Rhomboids  all left   Muscles Treated Lower Body Gluteus maximus  Rt   Upper Trapezius Response Palpable increased muscle length;Twitch reponse elicited   Levator Scapulae Response Palpable increased muscle length;Twitch response elicited   Rhomboids Response Palpable increased muscle length;Twitch response elicited   Gluteus Maximus Response Palpable increased muscle length;Twitch response elicited  Rt                   PT Long Term Goals - 03/02/17 1709      PT LONG TERM GOAL #1   Title I with HEP for posture re-ed ( 03/21/17)    Status On-going     PT LONG TERM GOAL #2   Title demo cervical rotation = each side without cramping in the neck ( 03/21/17)      PT LONG TERM GOAL #3   Title tolerate lifting weights and performing overhead activities without having cervical cramping ( 03/21/17)    Status --  pt has been able to  perform overhead presses, had one episode of cramping.      PT LONG TERM GOAL #4   Title report decreased LBP with lifitng and flexion to his baseline  (03/21/17)    Status Achieved  as of 03/02/17     PT LONG TERM GOAL #5   Title maintain upright posture with good alignment of head/neck/shoulders without requiring VC's ( 02/23/17)    Status Achieved     PT LONG TERM GOAL #6   Title improve FOTO =/< 25% limited ( 03/21/17)    Status On-going               Plan - 03/08/17 0841    Clinical Impression Statement Leonard Jackson was doing well until this AM, woke up with increased tightness in Lt neck/upper shoulder and Rt gluts.  Responded great to treatment,  DN and manual work to upper trap in prone and supine, Had good released insupine with increased ROM after.  Able to look down into Rt axialla easier   Rehab Potential Excellent   PT Frequency 1x / week   PT Duration 6 weeks   PT Treatment/Interventions Moist Heat;Dry needling;Taping;Manual  techniques;Cryotherapy;Patient/family education;Passive range of motion;Neuromuscular re-education   PT Next Visit Plan cont DN and manual work.    Consulted and Agree with Plan of Care Patient      Patient will benefit from skilled therapeutic intervention in order to improve the following deficits and impairments:  Decreased range of motion, Increased muscle spasms, Pain, Postural dysfunction  Visit Diagnosis: Other muscle spasm  Abnormal posture  Cervicalgia     Problem List Patient Active Problem List   Diagnosis Date Noted  . Shoulder weakness 07/13/2016  . History of tongue cancer 02/17/2016  . Hyperlipidemia 03/24/2015  . Hypogonadism in male 03/06/2014    Jeral Pinch PT  03/08/2017, 8:44 AM  [facility] [place], [place], [address] Phone: [phone]   Fax:  [phone]  Name: Leonard Jackson MRN: [id number] Date of Birth: 10/05/1975

## 2017-03-13 ENCOUNTER — Ambulatory Visit (INDEPENDENT_AMBULATORY_CARE_PROVIDER_SITE_OTHER): Payer: 59 | Admitting: Physical Therapy

## 2017-03-13 DIAGNOSIS — M542 Cervicalgia: Secondary | ICD-10-CM | POA: Diagnosis not present

## 2017-03-13 DIAGNOSIS — M62838 Other muscle spasm: Secondary | ICD-10-CM

## 2017-03-13 DIAGNOSIS — R293 Abnormal posture: Secondary | ICD-10-CM | POA: Diagnosis not present

## 2017-03-13 NOTE — Therapy (Signed)
[place], [place], [address] Phone: [phone]   Fax:  [phone]  Physical Therapy Treatment  Patient Details  Name: Leonard Jackson MRN: [id number] Date of Birth: Feb 24, 1976 Referring Provider: Dr Georgina Snell  Encounter Date: 03/13/2017      PT End of Session - 03/13/17 0727    Visit Number 6   Number of Visits 7   Date for PT Re-Evaluation 03/21/17   PT Start Time 0728  finished withheat   PT Stop Time 0808   PT Time Calculation (min) 40 min   Activity Tolerance Patient tolerated treatment well      Past Medical History:  Diagnosis Date  . DVT (deep venous thrombosis) ([place]) 06/21/2016  . Hypertriglyceridemia 03/05/2014  . Hypogonadism in male 03/06/2014  . Squamous cell carcinoma of base of tongue ([facility]) 02/17/2016   Surg Aug 2017 s/p XRT and chem.  Stage 4a    Past Surgical History:  Procedure Laterality Date  . LYMPH NODE DISSECTION Left    Neck  . TONSILLECTOMY      There were no vitals filed for this visit.      Subjective Assessment - 03/13/17 0728    Subjective Pt reports the neck has been feeling good. low back is killing him.  No cramp   Patient Stated Goals perform planks without neck pain, and other exercises.  Get rid of cramping, loosen muscles    Currently in Pain? No/denies  none inneck, Rt low back 5/10                          [facility] Adult PT Treatment/Exercise - 03/13/17 0001      Modalities   Modalities Moist Heat     Moist Heat Therapy   Number Minutes Moist Heat 10 Minutes   Moist Heat Location Cervical;Lumbar Spine     Manual Therapy   Manual Therapy Soft tissue mobilization   Joint Mobilization grade III UPA Rt lumbar 4-2 and CPA L5-1   Soft tissue mobilization STM to Rt lumbar paraspinals and Lt upper trap          Trigger Point Dry Needling - 03/13/17 0743    Consent Given? Yes   Education Handout Provided No   Upper Trapezius  Response Palpable increased muscle length;Twitch reponse elicited  Lt lying supine   Longissimus Response Palpable increased muscle length;Twitch response elicited  Rt Z5-6              PT Education - 03/13/17 0804    Education provided Yes   Education Details LTR and BKTC   Person(s) Educated Patient   Methods Explanation   Comprehension Verbalized understanding;Returned demonstration             PT Long Term Goals - 03/13/17 3875      PT LONG TERM GOAL #1   Title I with HEP for posture re-ed ( 03/21/17)    Status On-going     PT LONG TERM GOAL #2   Title demo cervical rotation = each side without cramping in the neck ( 03/21/17)    Status Partially Met  no cramping in the last week     PT LONG TERM GOAL #3   Title tolerate lifting weights and performing overhead activities without having cervical cramping ( 03/21/17)    Status On-going     PT LONG TERM GOAL #4   Title report decreased LBP with lifitng and flexion  to his baseline  (03/21/17)    Status Achieved     PT LONG TERM GOAL #5   Title maintain upright posture with good alignment of head/neck/shoulders without requiring VC's ( 02/23/17)    Status Achieved     PT LONG TERM GOAL #6   Title improve FOTO =/< 25% limited ( 03/21/17)    Status On-going               Plan - 03/13/17 0805    Clinical Impression Statement Dawood continues to have good releases with DN and increased tissue flexibility with STM.  He is progressing to goals.  He is having some increase in tightness/muscle spasms in the Rt low back.    Rehab Potential Excellent   PT Frequency 1x / week   PT Duration 6 weeks   PT Treatment/Interventions Moist Heat;Dry needling;Taping;Manual techniques;Cryotherapy;Patient/family education;Passive range of motion;Neuromuscular re-education   PT Next Visit Plan FOTO and reasses cont DN and manual work.       Patient will benefit from skilled therapeutic intervention in order to improve the  following deficits and impairments:  Decreased range of motion, Increased muscle spasms, Pain, Postural dysfunction  Visit Diagnosis: Other muscle spasm  Abnormal posture  Cervicalgia     Problem List Patient Active Problem List   Diagnosis Date Noted  . Shoulder weakness 07/13/2016  . History of tongue cancer 02/17/2016  . Hyperlipidemia 03/24/2015  . Hypogonadism in male 03/06/2014    Jeral Pinch PT  03/13/2017, [place], [place], [address] Phone: [phone]   Fax:  [phone]  Name: Windel Keziah MRN: [id number] Date of Birth: [birth year]

## 2017-03-21 ENCOUNTER — Encounter: Payer: Self-pay | Admitting: Physical Therapy

## 2017-03-21 ENCOUNTER — Ambulatory Visit (INDEPENDENT_AMBULATORY_CARE_PROVIDER_SITE_OTHER): Payer: 59 | Admitting: Physical Therapy

## 2017-03-21 DIAGNOSIS — R293 Abnormal posture: Secondary | ICD-10-CM

## 2017-03-21 DIAGNOSIS — M542 Cervicalgia: Secondary | ICD-10-CM | POA: Diagnosis not present

## 2017-03-21 DIAGNOSIS — M62838 Other muscle spasm: Secondary | ICD-10-CM | POA: Diagnosis not present

## 2017-03-21 NOTE — Therapy (Signed)
[place], [place], [address] Phone: [phone]   Fax:  [phone]  Physical Therapy Treatment  Patient Details  Name: Leonard Jackson MRN: [id number] Date of Birth: [birth year] Referring Provider: Dr Georgina Snell  Encounter Date: 03/21/2017      PT End of Session - 03/21/17 1019    Visit Number 7   PT Start Time 1020   PT Stop Time 1121   PT Time Calculation (min) 61 min   Activity Tolerance Patient tolerated treatment well      Past Medical History:  Diagnosis Date  . DVT (deep venous thrombosis) ([place] [facility]) 06/21/2016  . Hypertriglyceridemia 03/05/2014  . Hypogonadism in male 03/06/2014  . Squamous cell carcinoma of base of tongue ([place]) 02/17/2016   Surg Aug 2017 s/p XRT and chem.  Stage 4a    Past Surgical History:  Procedure Laterality Date  . LYMPH NODE DISSECTION Left    Neck  . TONSILLECTOMY      There were no vitals filed for this visit.      Subjective Assessment - 03/21/17 1026    Subjective Kaiyan reports he is doing good, He had modifiying how he is doing weighted squats and this has helped. He reports he is about 70% imporved.  Able to perform all ADLs and having no cramps. He is going to a rock climbing wall tonight to see how he does.    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 Has SLP and some PT after surgery.    Patient Stated Goals perform planks without neck pain, and other exercises.  Get rid of cramping, loosen muscles    Currently in Pain? No/denies            [facility] PT Assessment - 03/21/17 0001      Assessment   Medical Diagnosis cervical muscle spasms, low back spasm   Referring Provider Dr Georgina Snell   Onset Date/Surgical Date 11/05/16   Hand Dominance Right   Next MD Visit PRN   Prior Therapy 06/2016     Observation/Other Assessments   Focus on Therapeutic Outcomes (FOTO)  30% limited     AROM   Cervical - Right Rotation  88   Cervical - Left Rotation 85     Strength   Overall Strength Comments mid trap 5/5, low traps Lt 4+/5, Rt 5-/5                     [facility] Adult PT Treatment/Exercise - 03/21/17 0001      Neck Exercises: Supine   Other Supine Exercise upper body lift with scarecrow arms. x 10          Trigger Point Dry Needling - 03/21/17 1106    Consent Given? Yes   Education Handout Provided No   Muscles Treated Upper Body Upper trapezius;Levator scapulae;Longissimus  all Lt side   Upper Trapezius Response Palpable increased muscle length;Twitch reponse elicited   Levator Scapulae Response Palpable increased muscle length;Twitch response elicited   Longissimus Response Palpable increased muscle length;Twitch response elicited  cervical                   PT Long Term Goals - 03/21/17 1237      PT LONG TERM GOAL #1   Title I with HEP for posture re-ed ( 03/21/17)    Status Achieved     PT LONG TERM GOAL #2   Title demo cervical rotation = each  side without cramping in the neck ( 03/21/17)    Status Achieved     PT LONG TERM GOAL #3   Title tolerate lifting weights and performing overhead activities without having cervical cramping ( 03/21/17)    Status Achieved     PT LONG TERM GOAL #4   Title report decreased LBP with lifitng and flexion to his baseline  (03/21/17)    Status Achieved     PT LONG TERM GOAL #5   Title maintain upright posture with good alignment of head/neck/shoulders without requiring VC's ( 02/23/17)    Status Achieved     PT LONG TERM GOAL #6   Title improve FOTO =/< 25% limited ( 03/21/17)    Status Not Met  scored 30% limited                Plan - 03/21/17 1235    Clinical Impression Statement Kyo has functional improvement in his neck pain and motion,  He does still have tightness in the upper shoulder and cervical muscles, however not like he did at his initial visit.  He has partially met his goals and is pleased with his  progress. He wishes to continue with HEP and if the pain returns he will get a new order form his MD and return for more treatment.    PT Next Visit Plan discharge to HEP    Consulted and Agree with Plan of Care Patient      Patient will benefit from skilled therapeutic intervention in order to improve the following deficits and impairments:     Visit Diagnosis: Other muscle spasm  Abnormal posture  Cervicalgia     Problem List Patient Active Problem List   Diagnosis Date Noted  . Shoulder weakness 07/13/2016  . History of tongue cancer 02/17/2016  . Hyperlipidemia 03/24/2015  . Hypogonadism in male 03/06/2014    [facility] 03/21/2017, 12:38 PM  [place], [place], [address] Phone: [phone]   Fax:  [phone]  Name: Rickardo Brinegar MRN: [id number] Date of Birth: 1975-12-23   PHYSICAL THERAPY DISCHARGE SUMMARY  Visits from Start of Care: 7  Current functional level related to goals / functional outcomes: See above   Remaining deficits: No functional deficits, does have some continued tightness in the Lt upper shoulder/neck   Education / Equipment: HEP & DN Plan: Patient agrees to discharge.  Patient goals were partially met. Patient is being discharged due to being pleased with the current functional level.  ?????    Jeral Pinch, PT 03/21/17 12:40 PM

## 2017-04-10 ENCOUNTER — Other Ambulatory Visit: Payer: Self-pay | Admitting: Family Medicine

## 2017-04-10 ENCOUNTER — Telehealth: Payer: Self-pay | Admitting: Family Medicine

## 2017-04-10 NOTE — Telephone Encounter (Signed)
Patient walked-in just had labs done today and adv that [facility] in [place] on [address] sent over a refill request for testosterone.  Thanks

## 2017-04-11 LAB — COMPLETE METABOLIC PANEL WITH GFR
AG Ratio: 1.6 (calc) (ref 1.0–2.5)
ALBUMIN MSPROF: 4.2 g/dL (ref 3.6–5.1)
ALKALINE PHOSPHATASE (APISO): 63 U/L (ref 40–115)
ALT: 18 U/L (ref 9–46)
AST: 25 U/L (ref 10–40)
BILIRUBIN TOTAL: 0.6 mg/dL (ref 0.2–1.2)
BUN / CREAT RATIO: 9 (calc) (ref 6–22)
BUN: 13 mg/dL (ref 7–25)
CHLORIDE: 99 mmol/L (ref 98–110)
CO2: 27 mmol/L (ref 20–32)
Calcium: 9.5 mg/dL (ref 8.6–10.3)
Creat: 1.39 mg/dL — ABNORMAL HIGH (ref 0.60–1.35)
GFR, Est African American: 73 mL/min/{1.73_m2} (ref >=60)
GFR, Est Non African American: 63 mL/min/{1.73_m2} (ref >=60)
GLOBULIN: 2.6 g/dL (ref 1.9–3.7)
Glucose, Bld: 89 mg/dL (ref 65–99)
Potassium: 4.2 mmol/L (ref 3.5–5.3)
SODIUM: 137 mmol/L (ref 135–146)
Total Protein: 6.8 g/dL (ref 6.1–8.1)

## 2017-04-11 LAB — CBC
HCT: 48.9 % (ref 38.5–50.0)
Hemoglobin: 16.8 g/dL (ref 13.2–17.1)
MCH: 32.1 pg (ref 27.0–33.0)
MCHC: 34.4 g/dL (ref 32.0–36.0)
MCV: 93.3 fL (ref 80.0–100.0)
MPV: 9.8 fL (ref 7.5–12.5)
PLATELETS: 216 10*3/uL (ref 140–400)
RBC: 5.24 10*6/uL (ref 4.20–5.80)
RDW: 11.8 % (ref 11.0–15.0)
WBC: 8.3 10*3/uL (ref 3.8–10.8)

## 2017-04-11 LAB — LIPID PANEL W/REFLEX DIRECT LDL
CHOL/HDL RATIO: 4.2 (calc) (ref <5.0)
CHOLESTEROL: 175 mg/dL (ref <200)
HDL: 42 mg/dL (ref >40)
LDL Cholesterol (Calc): 100 mg/dL (calc) — ABNORMAL HIGH
Non-HDL Cholesterol (Calc): 133 mg/dL (calc) — ABNORMAL HIGH (ref <130)
Triglycerides: 214 mg/dL — ABNORMAL HIGH (ref <150)

## 2017-04-11 LAB — TESTOSTERONE: Testosterone: 1165 ng/dL — ABNORMAL HIGH (ref 250–827)

## 2017-04-11 LAB — PSA: PSA: 0.7 ng/mL (ref <=4.0)

## 2017-07-10 ENCOUNTER — Other Ambulatory Visit: Payer: Self-pay | Admitting: Family Medicine

## 2017-07-13 ENCOUNTER — Other Ambulatory Visit: Payer: Self-pay | Admitting: Family Medicine

## 2017-07-13 DIAGNOSIS — E291 Testicular hypofunction: Secondary | ICD-10-CM

## 2017-07-13 NOTE — Progress Notes (Signed)
Placed labs at [facility] due to appt time tomorrow. Pt advised to go to lab for an early morning draw rather than getting them done today due to insurance preference.

## 2017-07-14 ENCOUNTER — Encounter: Payer: Self-pay | Admitting: Family Medicine

## 2017-07-14 ENCOUNTER — Ambulatory Visit: Payer: 59 | Admitting: Family Medicine

## 2017-07-14 VITALS — BP 132/88 | HR 91 | Ht 75.0 in | Wt 190.0 lb

## 2017-07-14 DIAGNOSIS — E291 Testicular hypofunction: Secondary | ICD-10-CM | POA: Diagnosis not present

## 2017-07-14 DIAGNOSIS — E782 Mixed hyperlipidemia: Secondary | ICD-10-CM

## 2017-07-14 LAB — TESTOSTERONE: Testosterone: 486 ng/dL (ref 250–827)

## 2017-07-14 MED ORDER — VARENICLINE TARTRATE 1 MG PO TABS: 1.0000 mg | ORAL_TABLET | Freq: Two times a day (BID) | ORAL | 3 refills | Status: DC

## 2017-07-14 MED ORDER — TESTOSTERONE CYPIONATE 200 MG/ML IM SOLN: 200.0000 mg | INTRAMUSCULAR | 0 refills | Status: DC

## 2017-07-14 NOTE — Patient Instructions (Addendum)
Thank you for coming in today.  Work on cutting back or quit smoking.   Varenicline oral tablets What is this medicine? VARENICLINE (var EN i kleen) is used to help people quit smoking. It can reduce the symptoms caused by stopping smoking. It is used with a patient support program recommended by your physician. This medicine may be used for other purposes; ask your health care provider or pharmacist if you have questions. COMMON BRAND NAME(S): Chantix What should I tell my health care provider before I take this medicine? They need to know if you have any of these conditions: -bipolar disorder, depression, schizophrenia or other mental illness -heart disease -if you often drink alcohol -kidney disease -peripheral vascular disease -seizures -stroke -suicidal thoughts, plans, or attempt; a previous suicide attempt by you or a family member -an unusual or allergic reaction to varenicline, other medicines, foods, dyes, or preservatives -pregnant or trying to get pregnant -breast-feeding How should I use this medicine? Take this medicine by mouth after eating. Take with a full glass of water. Follow the directions on the prescription label. Take your doses at regular intervals. Do not take your medicine more often than directed. There are 3 ways you can use this medicine to help you quit smoking; talk to your health care professional to decide which plan is right for you: 1) you can choose a quit date and start this medicine 1 week before the quit date, or, 2) you can start taking this medicine before you choose a quit date, and then pick a quit date between day 8 and 35 days of treatment, or, 3) if you are not sure that you are able or willing to quit smoking right away, start taking this medicine and slowly decrease the amount you smoke as directed by your health care professional with the goal of being cigarette-free by week 12 of treatment. Stick to your plan; ask about support groups or  other ways to help you remain cigarette-free. If you are motivated to quit smoking and did not succeed during a previous attempt with this medicine for reasons other than side effects, or if you returned to smoking after this treatment, speak with your health care professional about whether another course of this medicine may be right for you. A special MedGuide will be given to you by the pharmacist with each prescription and refill. Be sure to read this information carefully each time. Talk to your pediatrician regarding the use of this medicine in children. This medicine is not approved for use in children. Overdosage: If you think you have taken too much of this medicine contact a poison control center or emergency room at once. NOTE: This medicine is only for you. Do not share this medicine with others. What if I miss a dose? If you miss a dose, take it as soon as you can. If it is almost time for your next dose, take only that dose. Do not take double or extra doses. What may interact with this medicine? -alcohol or any product that contains alcohol -insulin -other stop smoking aids -theophylline -warfarin This list may not describe all possible interactions. Give your health care provider a list of all the medicines, herbs, non-prescription drugs, or dietary supplements you use. Also tell them if you smoke, drink alcohol, or use illegal drugs. Some items may interact with your medicine. What should I watch for while using this medicine? Visit your doctor or health care professional for regular check ups. Ask for ongoing advice  and encouragement from your doctor or healthcare professional, friends, and family to help you quit. If you smoke while on this medication, quit again Your mouth may get dry. Chewing sugarless gum or sucking hard candy, and drinking plenty of water may help. Contact your doctor if the problem does not go away or is severe. You may get drowsy or dizzy. Do not drive, use  machinery, or do anything that needs mental alertness until you know how this medicine affects you. Do not stand or sit up quickly, especially if you are an older patient. This reduces the risk of dizzy or fainting spells. Sleepwalking can happen during treatment with this medicine, and can sometimes lead to behavior that is harmful to you, other people, or property. Stop taking this medicine and tell your doctor if you start sleepwalking or have other unusual sleep-related activity. Decrease the amount of alcoholic beverages that you drink during treatment with this medicine until you know if this medicine affects your ability to tolerate alcohol. Some people have experienced increased drunkenness (intoxication), unusual or sometimes aggressive behavior, or no memory of things that have happened (amnesia) during treatment with this medicine. The use of this medicine may increase the chance of suicidal thoughts or actions. Pay special attention to how you are responding while on this medicine. Any worsening of mood, or thoughts of suicide or dying should be reported to your health care professional right away. What side effects may I notice from receiving this medicine? Side effects that you should report to your doctor or health care professional as soon as possible: -allergic reactions like skin rash, itching or hives, swelling of the face, lips, tongue, or throat -acting aggressive, being angry or violent, or acting on dangerous impulses -breathing problems -changes in vision -chest pain or chest tightness -confusion, trouble speaking or understanding -new or worsening depression, anxiety, or panic attacks -extreme increase in activity and talking (mania) -fast, irregular heartbeat -feeling faint or lightheaded, falls -fever -pain in legs when walking -problems with balance, talking, walking -redness, blistering, peeling or loosening of the skin, including inside the mouth -ringing in  ears -seeing or hearing things that aren't there (hallucinations) -seizures -sleepwalking -sudden numbness or weakness of the face, arm or leg -thoughts about suicide or dying, or attempts to commit suicide -trouble passing urine or change in the amount of urine -unusual bleeding or bruising -unusually weak or tired Side effects that usually do not require medical attention (report to your doctor or health care professional if they continue or are bothersome): -constipation -headache -nausea, vomiting -strange dreams -stomach gas -trouble sleeping This list may not describe all possible side effects. Call your doctor for medical advice about side effects. You may report side effects to FDA at 1-800-FDA-1088. Where should I keep my medicine? Keep out of the reach of children. Store at room temperature between 15 and 30 degrees C (59 and 86 degrees F). Throw away any unused medicine after the expiration date. NOTE: This sheet is a summary. It may not cover all possible information. If you have questions about this medicine, talk to your doctor, pharmacist, or health care provider.  2018 Elsevier/Gold Standard (2015-03-26 16:14:23)

## 2017-07-14 NOTE — Progress Notes (Signed)
Leonard Jackson is a 41 y.o. male who presents to [facility] today for follow-up testosterone.  Leonard Jackson is currently receiving 150 mg of IM Depakote testosterone weekly.  He notes this works quite well.  The dose was reduced a few months ago when his testosterone level was too high.  He tolerates the medication well and notes that he feels great and has increased energy.  Additionally Leonard Jackson notes that he restarted smoking.  He finds this very frustrating because he is a head neck cancer survivor.  He currently smoking about 6 cigarettes a day.  His wife also smokes.  He is interested in restarting Chantix.  He notes in the past he has gone straight to the treatment dose without doing the starting pack and tolerated it well.  He like to go starting pack.   Past Medical History:  Diagnosis Date  . DVT (deep venous thrombosis) ([place]) 06/21/2016  . Hypertriglyceridemia 03/05/2014  . Hypogonadism in male 03/06/2014  . Squamous cell carcinoma of base of tongue ([place]) 02/17/2016   Surg Aug 2017 s/p XRT and chem.  Stage 4a   Past Surgical History:  Procedure Laterality Date  . LYMPH NODE DISSECTION Left    Neck  . TONSILLECTOMY     Social History   Tobacco Use  . Smoking status: Former Smoker    Last attempt to quit: 05/12/2012    Years since quitting: 5.1  . Smokeless tobacco: Never Used  Substance Use Topics  . Alcohol use: Yes    Alcohol/week: 3.0 oz    Types: 6 Standard drinks or equivalent per week   family history is not on file.  ROS as above:  Medications: Current Outpatient Medications  Medication Sig Dispense Refill  . atorvastatin (LIPITOR) 10 MG tablet Take 1 tablet (10 mg total) by mouth daily. 90 tablet 3  . cyclobenzaprine (FLEXERIL) 10 MG tablet Take 1 tablet (10 mg total) by mouth 3 (three) times daily as needed for muscle spasms. 30 tablet 0  .  LORazepam (ATIVAN) 0.5 MG tablet Take 1 tablet (0.5 mg total) by mouth every 8 (eight) hours as needed for anxiety. 30 tablet 1  . testosterone cypionate (DEPOTESTOSTERONE CYPIONATE) 200 MG/ML injection Inject 1 mL (200 mg total) into the muscle once a week. 13 mL 0  . varenicline (CHANTIX) 1 MG tablet Take 1 tablet (1 mg total) by mouth 2 (two) times daily. 60 tablet 3   No current facility-administered medications for this visit.    No Known Allergies  Health Maintenance Health Maintenance  Topic Date Due  . HIV Screening  05/04/1991  . TETANUS/TDAP  03/05/2024  . INFLUENZA VACCINE  Completed     Exam:  BP 132/88   Pulse 91   Ht 6\' 3"  (1.905 m)   Wt 190 lb (86.2 kg)   BMI 23.75 kg/m  Gen: Well NAD HEENT: EOMI,  MMM Lungs: Normal work of breathing. CTABL Heart: RRR no MRG Abd: NABS, Soft. Nondistended, Nontender Exts: Brisk capillary refill, warm and well perfused.    Results for orders placed or performed in visit on 07/13/17 (from the past 72 hour(s))  Testosterone     Status: None   Collection Time: 07/14/17  7:51 AM  Result Value Ref Range   Testosterone 486 250 - 827 ng/dL   No results found.    Assessment and Plan: 41 y.o. male with  Testosterone doing well continue current regimen.  3 months supply prescribed.  Recheck every 6 months.  Smoking represcribed Chantix recheck in 3-6 months.   No orders of the defined types were placed in this encounter.  Meds ordered this encounter  Medications  . testosterone cypionate (DEPOTESTOSTERONE CYPIONATE) 200 MG/ML injection    Sig: Inject 1 mL (200 mg total) into the muscle once a week.    Dispense:  13 mL    Refill:  0  . varenicline (CHANTIX) 1 MG tablet    Sig: Take 1 tablet (1 mg total) by mouth 2 (two) times daily.    Dispense:  60 tablet    Refill:  3     Discussed warning signs or symptoms. Please see discharge instructions. Patient expresses understanding.

## 2017-10-11 ENCOUNTER — Other Ambulatory Visit: Payer: Self-pay | Admitting: Family Medicine

## 2017-10-12 ENCOUNTER — Telehealth: Payer: Self-pay | Admitting: Family Medicine

## 2017-10-12 NOTE — Telephone Encounter (Signed)
Received fax from [employer] that  was approved from 09/12/2017 until 10/11/2020. Pharmacy notified and forms sent to scan.   Reference ID: [id number].

## 2017-10-12 NOTE — Telephone Encounter (Signed)
Received fax from [employer] that Testosterone requires a PA. Information has been sent to the insurance company. Awaiting determination.   

## 2017-11-20 ENCOUNTER — Ambulatory Visit: Payer: 59 | Admitting: Family Medicine

## 2017-11-20 DIAGNOSIS — Z0189 Encounter for other specified special examinations: Secondary | ICD-10-CM

## 2018-01-01 ENCOUNTER — Encounter: Payer: Self-pay | Admitting: Family Medicine

## 2018-01-01 DIAGNOSIS — E291 Testicular hypofunction: Secondary | ICD-10-CM

## 2018-01-05 LAB — TESTOSTERONE: Testosterone: 116 ng/dL — ABNORMAL LOW (ref 250–827)

## 2018-01-09 ENCOUNTER — Encounter: Payer: Self-pay | Admitting: Family Medicine

## 2018-01-09 ENCOUNTER — Ambulatory Visit: Payer: 59 | Admitting: Family Medicine

## 2018-01-09 VITALS — BP 138/88 | HR 87 | Wt 196.0 lb

## 2018-01-09 DIAGNOSIS — E291 Testicular hypofunction: Secondary | ICD-10-CM | POA: Diagnosis not present

## 2018-01-09 DIAGNOSIS — Z85810 Personal history of malignant neoplasm of tongue: Secondary | ICD-10-CM

## 2018-01-09 DIAGNOSIS — E782 Mixed hyperlipidemia: Secondary | ICD-10-CM

## 2018-01-09 MED ORDER — TESTOSTERONE CYPIONATE 200 MG/ML IM SOLN
200.0000 mg | INTRAMUSCULAR | 0 refills | Status: DC
Start: 2018-01-09 — End: 2018-04-09

## 2018-01-09 NOTE — Patient Instructions (Signed)
Thank you for coming in today. Get fasting AM lab midcycle in about 6 weeks.  Recheck with me in about 6-8 months.  Return sooner if needed.

## 2018-01-09 NOTE — Progress Notes (Signed)
Leonard Jackson is a 42 y.o. male who presents to [facility] today for follow-up testosterone,  Leonard Jackson has a history of hypogonadism.  He has been taking testosterone 200 mg every week IM.  He notes he last took the testosterone about 2-1/2 weeks ago when he got his recent labs which showed low testosterone around 100.  Previously he had significantly severely elevated testosterone and notes that he had gotten his labs pretty soon after injecting his testosterone.  He notes he deftly feels better on the testosterone and when he is off of it feels fatigued and low libido.  He notes that by using Chantix he was able to quit smoking again and feels quite happy about that.  Additionally he has a history of mild hyperlipidemia with increased CVD risk. In the past has been prescribed atorvastatin but is not taking it.  He notes that he will be getting his fasting lipids checked via a work physical in the near future.   ROS as above:  Exam:  BP 138/88   Pulse 87   Wt 196 lb (88.9 kg)   BMI 24.50 kg/m  Gen: Well NAD HEENT: EOMI,  MMM Lungs: Normal work of breathing. CTABL Heart: RRR no MRG Abd: NABS, Soft. Nondistended, Nontender Exts: Brisk capillary refill, warm and well perfused.   Lab and Radiology Results Lab Results  Component Value Date   TESTOSTERONE 116 (L) 01/04/2018     Assessment and Plan: 42 y.o. male with  Low Testosterone: Resume testosterone 200 mg every week.  Recheck testosterone in about 6 weeks next cycle fasting.  Additional check CBC PSA and metabolic panel for routine screening for testosterone safety.    Anticipate lipids via work physical in the near future we will obtain those labs soon.  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  This should put him at higher risk for arthrosclerosis in this area in the future and a lower LDL goal is probably reasonable.  History of smoking now quit.  Doing great continue to follow along.  I spent 25 minutes with this patient, greater than 50% was face-to-face time counseling regarding treatment plan and how to successfully test for testosterone level.  Orders Placed This Encounter  Procedures  . CBC  . PSA  . Testosterone  . COMPLETE METABOLIC PANEL WITH GFR   Meds ordered this encounter  Medications  . testosterone cypionate (DEPOTESTOSTERONE CYPIONATE) 200 MG/ML injection    Sig: Inject 1 mL (200 mg total) into the muscle once a week.    Dispense:  13 mL    Refill:  0    Not to exceed 5 additional fills before 01/10/2018.     Historical information moved to improve visibility of documentation.  Past Medical History:  Diagnosis Date  . DVT (deep venous thrombosis) ([place]) 06/21/2016  . Hypertriglyceridemia 03/05/2014  . Hypogonadism in male 03/06/2014  . 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  Stage 4a   Past Surgical History:  Procedure Laterality Date  . LYMPH NODE DISSECTION Left    Neck  . TONSILLECTOMY     Social History   Tobacco Use  . Smoking status: Former Smoker    Last attempt to quit:  05/12/2012    Years since quitting: 5.6  . Smokeless tobacco: Never Used  Substance Use Topics  . Alcohol use: Yes    Alcohol/week: 3.6 oz    Types: 6 Standard drinks or equivalent per week   family history is not on file.  Medications: Current Outpatient Medications  Medication Sig Dispense Refill  . cyclobenzaprine (FLEXERIL) 10 MG tablet Take 1 tablet (10 mg total) by mouth 3 (three) times daily as needed for muscle spasms. 30 tablet 0  . LORazepam (ATIVAN) 0.5 MG tablet Take 1 tablet (0.5 mg total) by mouth every 8 (eight) hours as needed for anxiety. 30 tablet 1  .  testosterone cypionate (DEPOTESTOSTERONE CYPIONATE) 200 MG/ML injection Inject 1 mL (200 mg total) into the muscle once a week. 13 mL 0   No current facility-administered medications for this visit.    No Known Allergies  Health Maintenance Health Maintenance  Topic Date Due  . HIV Screening  05/04/1991  . INFLUENZA VACCINE  02/22/2018  . TETANUS/TDAP  03/05/2024    Discussed warning signs or symptoms. Please see discharge instructions. Patient expresses understanding.

## 2018-01-16 LAB — BASIC METABOLIC PANEL: GLUCOSE: 87

## 2018-01-16 LAB — LIPID PANEL
CHOLESTEROL: 167 (ref 0–200)
HDL: 26 — AB (ref 35–70)

## 2018-01-18 ENCOUNTER — Encounter: Payer: Self-pay | Admitting: Family Medicine

## 2018-01-18 DIAGNOSIS — E782 Mixed hyperlipidemia: Secondary | ICD-10-CM

## 2018-01-18 NOTE — Addendum Note (Signed)
Addended by: Gregor Hams on: 01/18/2018 12:26 PM   Modules accepted: Orders

## 2018-01-30 ENCOUNTER — Encounter: Payer: Self-pay | Admitting: Family Medicine

## 2018-01-30 LAB — TESTOSTERONE
Testosterone: 214
Testosterone: 289
Testosterone: 289
Testosterone: 303

## 2018-03-05 ENCOUNTER — Encounter: Payer: Self-pay | Admitting: Family Medicine

## 2018-03-13 LAB — LIPID PANEL W/REFLEX DIRECT LDL
CHOL/HDL RATIO: 3.9 (calc) (ref <5.0)
Cholesterol: 185 mg/dL (ref <200)
HDL: 47 mg/dL (ref >40)
LDL Cholesterol (Calc): 118 mg/dL (calc) — ABNORMAL HIGH
Non-HDL Cholesterol (Calc): 138 mg/dL (calc) — ABNORMAL HIGH (ref <130)
Triglycerides: 94 mg/dL (ref <150)

## 2018-03-14 LAB — COMPLETE METABOLIC PANEL WITH GFR
AG Ratio: 1.8 (calc) (ref 1.0–2.5)
ALKALINE PHOSPHATASE (APISO): 67 U/L (ref 40–115)
ALT: 23 U/L (ref 9–46)
AST: 24 U/L (ref 10–40)
Albumin: 4.5 g/dL (ref 3.6–5.1)
BILIRUBIN TOTAL: 0.9 mg/dL (ref 0.2–1.2)
BUN/Creatinine Ratio: 13 (calc) (ref 6–22)
BUN: 19 mg/dL (ref 7–25)
CHLORIDE: 100 mmol/L (ref 98–110)
CO2: 30 mmol/L (ref 20–32)
CREATININE: 1.42 mg/dL — AB (ref 0.60–1.35)
Calcium: 9.4 mg/dL (ref 8.6–10.3)
GFR, Est African American: 71 mL/min/{1.73_m2} (ref >=60)
GFR, Est Non African American: 61 mL/min/{1.73_m2} (ref >=60)
GLUCOSE: 99 mg/dL (ref 65–99)
Globulin: 2.5 g/dL (calc) (ref 1.9–3.7)
Potassium: 4.7 mmol/L (ref 3.5–5.3)
Sodium: 138 mmol/L (ref 135–146)
Total Protein: 7 g/dL (ref 6.1–8.1)

## 2018-03-14 LAB — CBC
HCT: 53.7 % — ABNORMAL HIGH (ref 38.5–50.0)
Hemoglobin: 17.9 g/dL — ABNORMAL HIGH (ref 13.2–17.1)
MCH: 31.4 pg (ref 27.0–33.0)
MCHC: 33.3 g/dL (ref 32.0–36.0)
MCV: 94.2 fL (ref 80.0–100.0)
MPV: 9.6 fL (ref 7.5–12.5)
PLATELETS: 218 10*3/uL (ref 140–400)
RBC: 5.7 10*6/uL (ref 4.20–5.80)
RDW: 11.6 % (ref 11.0–15.0)
WBC: 6.2 10*3/uL (ref 3.8–10.8)

## 2018-03-14 LAB — TESTOSTERONE: TESTOSTERONE: 1111 ng/dL — AB (ref 250–827)

## 2018-03-14 LAB — PSA: PSA: 0.8 ng/mL (ref <=4.0)

## 2018-04-09 ENCOUNTER — Other Ambulatory Visit: Payer: Self-pay | Admitting: Family Medicine

## 2018-05-14 ENCOUNTER — Encounter: Payer: Self-pay | Admitting: Family Medicine

## 2018-05-15 LAB — TSH: TSH: 5.97 — AB (ref 0.41–5.90)

## 2018-05-17 ENCOUNTER — Telehealth: Payer: Self-pay

## 2018-05-17 NOTE — Telephone Encounter (Signed)
Error. Rhonda Cunningham,CMA  

## 2018-05-21 ENCOUNTER — Encounter: Payer: Self-pay | Admitting: Family Medicine

## 2018-05-21 ENCOUNTER — Ambulatory Visit (INDEPENDENT_AMBULATORY_CARE_PROVIDER_SITE_OTHER): Payer: 59 | Admitting: Family Medicine

## 2018-05-21 VITALS — BP 129/89 | HR 91 | Ht 75.0 in | Wt 199.0 lb

## 2018-05-21 DIAGNOSIS — R41840 Attention and concentration deficit: Secondary | ICD-10-CM

## 2018-05-21 DIAGNOSIS — Z23 Encounter for immunization: Secondary | ICD-10-CM | POA: Diagnosis not present

## 2018-05-21 DIAGNOSIS — R7989 Other specified abnormal findings of blood chemistry: Secondary | ICD-10-CM

## 2018-05-21 DIAGNOSIS — Z0000 Encounter for general adult medical examination without abnormal findings: Secondary | ICD-10-CM | POA: Diagnosis not present

## 2018-05-21 DIAGNOSIS — E291 Testicular hypofunction: Secondary | ICD-10-CM

## 2018-05-21 DIAGNOSIS — E039 Hypothyroidism, unspecified: Secondary | ICD-10-CM

## 2018-05-21 DIAGNOSIS — R718 Other abnormality of red blood cells: Secondary | ICD-10-CM

## 2018-05-21 DIAGNOSIS — Z6824 Body mass index (BMI) 24.0-24.9, adult: Secondary | ICD-10-CM

## 2018-05-21 HISTORY — DX: Hypothyroidism, unspecified: E03.9

## 2018-05-21 NOTE — Patient Instructions (Addendum)
Thank you for coming in today. Get testosterone lab recheck in 1 week fasting in the morning.   Contact [facility] [facility] specialists to get an appointment to eval for ADHD like symptoms.

## 2018-05-21 NOTE — Progress Notes (Signed)
Leonard Jackson is a 42 y.o. male who presents to [facility] today for well adult visit.  Leonard Jackson is doing reasonably well.  He has decreased his testosterone dose as noted previously.  He notes he feels as though his testosterone decreases by the end of a 2-week.  But overall is pretty happy with how things are going.  Additionally he had follow-up from his radiation oncologist following his x-ray therapy for head neck cancer.  He is doing clinically quite well but had a mildly elevated TSH at 5.972 on 05/15/18.   He was advised that this labs to be followed up in about 3 months.  He denies feeling too cold or too hot skin hair or nail changes.  He does however note that he has had trouble focusing and concentrating since his chemotherapy for head and neck cancer 2 years ago. He notes difficulty with boring projects and fine details.  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  He notes symptoms occur both at home and at work.  He denies any prior history of ADHD symptoms as a child.   ROS as above:  Past Medical History:  Diagnosis Date  . DVT (deep venous thrombosis) ([place]) 06/21/2016  . Hypertriglyceridemia 03/05/2014  . Hypogonadism in male 03/06/2014  . 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  Stage 4a   Past Surgical History:  Procedure Laterality Date  . LYMPH NODE DISSECTION Left    Neck  . TONSILLECTOMY     Social History   Tobacco Use  . Smoking status: Former Smoker    Last attempt to quit: 05/12/2017    Years since quitting: 1.0  . Smokeless tobacco: Never Used  Substance Use Topics  . Alcohol use: Yes    Alcohol/week: 6.0 standard drinks    Types: 6 Standard drinks or equivalent per week   family history is not on file.  Medications: Current Outpatient Medications  Medication Sig Dispense  Refill  . chlorhexidine (PERIDEX) 0.12 % solution SWISH AND SPIT for 30 seconds twice daily (morning and evening) after toothbrushing    . cyclobenzaprine (FLEXERIL) 10 MG tablet Take 1 tablet (10 mg total) by mouth 3 (three) times daily as needed for muscle spasms. 30 tablet 0  . LORazepam (ATIVAN) 0.5 MG tablet Take 1 tablet (0.5 mg total) by mouth every 8 (eight) hours as needed for anxiety. 30 tablet 1  . sodium fluoride (SF) 1.1 % GEL dental gel Place onto teeth.    . testosterone cypionate (DEPOTESTOSTERONE CYPIONATE) 200 MG/ML injection INJECT 1 ML (200 MG TOTAL) INTO THE MUSCLE ONCE A WEEK. 12 mL 0   No current facility-administered medications for this visit.    No Known Allergies  Health Maintenance Health Maintenance  Topic Date Due  . HIV Screening  01/10/2019 (Originally 05/04/1991)  . TETANUS/TDAP  03/05/2024  . INFLUENZA VACCINE  Completed     Exam:  BP 129/89   Pulse 91   Ht 6\' 3"  (1.905 m)   Wt 199 lb (90.3 kg)   BMI 24.87 kg/m  Wt Readings from Last 5 Encounters:  05/21/18 199 lb (90.3 kg)  01/09/18 196 lb (88.9 kg)  07/14/17 190 lb (86.2 kg)  01/04/17 194 lb (88 kg)  09/21/16 182 lb (82.6 kg)      Gen: Well NAD HEENT: EOMI,  MMM no neck masses.  Lungs: Normal work of breathing. CTABL Heart: RRR no MRG Abd: NABS, Soft. Nondistended, Nontender Exts: Brisk capillary refill, warm and well perfused.  Psych: Alert and oriented normal speech thought process and affect.  Adult ADHD self reporting scale significantly positive    Lab and Radiology Results Lab Results  Component Value Date   TESTOSTERONE 1,111 (H) 03/13/2018   Lab Results  Component Value Date   WBC 6.2 03/13/2018   HGB 17.9 (H) 03/13/2018   HCT 53.7 (H) 03/13/2018   MCV 94.2 03/13/2018   PLT 218 03/13/2018    Lab Results  Component Value Date   TSH 5.97 (A) 05/15/2018     Assessment and Plan: 42 y.o. male with  Well adult doing reasonably well.  Concern ADHD-like  symptoms following chemotherapy.  This is not an uncommon situation.  Patient will not be criteria for ADHD since he did not have symptoms as a child but he certainly has similar symptoms now.  It is reasonable to refer to [place] attention specialist for initial evaluation and likely initial treatment titration.  Elevated TSH: Unclear etiology.  Plan to check T3 and T4 levels now and recheck TSH in about 3 months.  Elevated testosterone and hemoglobin: Testosterone has been decreased about 2 months ago.  Plan to recheck in the near future.  Assess metabolic panel given mildly elevated creatinine seen previously.  Has successfully quit smoking.  Orders Placed This Encounter  Procedures  . Flu Vaccine QUAD 36+ mos IM  . CBC  . Testosterone  . T3, free  . T4, free  . COMPLETE METABOLIC PANEL WITH GFR  . TSH    This external order was created through the Results Console.  . Ambulatory referral to Psychiatry    Referral Priority:   Routine    Referral Type:   Psychiatric    Referral Reason:   Specialty Services Required    Requested Specialty:   Psychiatry    Number of Visits Requested:   1   No orders of the defined types were placed in this encounter.    Discussed warning signs or symptoms. Please see discharge instructions. Patient expresses understanding.

## 2018-05-31 ENCOUNTER — Encounter: Payer: Self-pay | Admitting: Family Medicine

## 2018-05-31 LAB — COMPLETE METABOLIC PANEL WITH GFR
AG Ratio: 2 (calc) (ref 1.0–2.5)
ALT: 26 U/L (ref 9–46)
AST: 36 U/L (ref 10–40)
Albumin: 4.5 g/dL (ref 3.6–5.1)
Alkaline phosphatase (APISO): 64 U/L (ref 40–115)
BUN/Creatinine Ratio: 10 (calc) (ref 6–22)
BUN: 14 mg/dL (ref 7–25)
CALCIUM: 9.4 mg/dL (ref 8.6–10.3)
CO2: 31 mmol/L (ref 20–32)
CREATININE: 1.46 mg/dL — AB (ref 0.60–1.35)
Chloride: 101 mmol/L (ref 98–110)
GFR, EST NON AFRICAN AMERICAN: 58 mL/min/{1.73_m2} — AB (ref >=60)
GFR, Est African American: 68 mL/min/{1.73_m2} (ref >=60)
Globulin: 2.3 g/dL (calc) (ref 1.9–3.7)
Glucose, Bld: 96 mg/dL (ref 65–99)
Potassium: 4.4 mmol/L (ref 3.5–5.3)
Sodium: 139 mmol/L (ref 135–146)
TOTAL PROTEIN: 6.8 g/dL (ref 6.1–8.1)
Total Bilirubin: 1.3 mg/dL — ABNORMAL HIGH (ref 0.2–1.2)

## 2018-05-31 LAB — TESTOSTERONE: Testosterone: 608 ng/dL (ref 250–827)

## 2018-05-31 LAB — CBC
HEMATOCRIT: 50.6 % — AB (ref 38.5–50.0)
Hemoglobin: 17.4 g/dL — ABNORMAL HIGH (ref 13.2–17.1)
MCH: 31.4 pg (ref 27.0–33.0)
MCHC: 34.4 g/dL (ref 32.0–36.0)
MCV: 91.2 fL (ref 80.0–100.0)
MPV: 9.5 fL (ref 7.5–12.5)
PLATELETS: 237 10*3/uL (ref 140–400)
RBC: 5.55 10*6/uL (ref 4.20–5.80)
RDW: 12.3 % (ref 11.0–15.0)
WBC: 6 10*3/uL (ref 3.8–10.8)

## 2018-05-31 LAB — T3, FREE: T3 FREE: 2.7 pg/mL (ref 2.3–4.2)

## 2018-05-31 LAB — T4, FREE: FREE T4: 0.7 ng/dL — AB (ref 0.8–1.8)

## 2018-05-31 MED ORDER — LEVOTHYROXINE SODIUM 50 MCG PO TABS: 50.0000 ug | ORAL_TABLET | Freq: Every day | ORAL | 0 refills | Status: DC

## 2018-05-31 NOTE — Addendum Note (Signed)
Addended by: Gregor Hams on: 05/31/2018 05:59 AM   Modules accepted: Orders

## 2018-06-11 ENCOUNTER — Encounter: Payer: Self-pay | Admitting: Family Medicine

## 2018-06-19 ENCOUNTER — Encounter: Payer: Self-pay | Admitting: Family Medicine

## 2018-07-03 ENCOUNTER — Ambulatory Visit: Payer: 59 | Admitting: Family Medicine

## 2018-07-07 ENCOUNTER — Other Ambulatory Visit: Payer: Self-pay | Admitting: Family Medicine

## 2018-07-26 ENCOUNTER — Telehealth: Payer: Self-pay | Admitting: Family Medicine

## 2018-07-26 DIAGNOSIS — E039 Hypothyroidism, unspecified: Secondary | ICD-10-CM

## 2018-07-26 DIAGNOSIS — R7989 Other specified abnormal findings of blood chemistry: Secondary | ICD-10-CM

## 2018-07-26 NOTE — Telephone Encounter (Signed)
Order TSH to recheck thyroid levels after starting levothyroxine.

## 2018-07-26 NOTE — Telephone Encounter (Signed)
Pt advised.

## 2018-07-26 NOTE — Telephone Encounter (Signed)
-----   Message from Gregor Hams, MD sent at 05/31/2018  5:59 AM EST ----- Regarding: Recheck thyroid Started levothyroxine recheck thyroid 6 to 12 weeks.

## 2018-07-31 LAB — TSH: TSH: 6.69 mIU/L — ABNORMAL HIGH (ref 0.40–4.50)

## 2018-07-31 MED ORDER — LEVOTHYROXINE SODIUM 75 MCG PO TABS: 75.0000 ug | ORAL_TABLET | Freq: Every day | ORAL | 1 refills | Status: DC

## 2018-07-31 NOTE — Addendum Note (Signed)
Addended by: Gregor Hams on: 07/31/2018 06:51 AM   Modules accepted: Orders

## 2018-08-23 ENCOUNTER — Other Ambulatory Visit: Payer: Self-pay | Admitting: Family Medicine

## 2018-08-23 DIAGNOSIS — E039 Hypothyroidism, unspecified: Secondary | ICD-10-CM

## 2018-09-11 ENCOUNTER — Telehealth: Payer: Self-pay | Admitting: Family Medicine

## 2018-09-11 DIAGNOSIS — E039 Hypothyroidism, unspecified: Secondary | ICD-10-CM

## 2018-09-11 DIAGNOSIS — R7989 Other specified abnormal findings of blood chemistry: Secondary | ICD-10-CM

## 2018-09-11 NOTE — Telephone Encounter (Signed)
-----   Message from Gregor Hams, MD sent at 07/31/2018  6:51 AM EST ----- Regarding: Recheck TSH Recheck TSH in 6 weeks after increasing levothyroxine dose for the second time.

## 2018-09-11 NOTE — Telephone Encounter (Signed)
TSH due for recheck after increasing levothyroxine dose.

## 2018-09-13 NOTE — Telephone Encounter (Signed)
Patient reviewed on mychart. Rhonda Cunningham,CMA

## 2018-09-14 ENCOUNTER — Encounter: Payer: Self-pay | Admitting: Family Medicine

## 2018-09-22 LAB — TSH: TSH: 2.95 mIU/L (ref 0.40–4.50)

## 2018-09-24 MED ORDER — LEVOTHYROXINE SODIUM 75 MCG PO TABS: 75.0000 ug | ORAL_TABLET | Freq: Every day | ORAL | 1 refills | Status: DC

## 2018-09-24 NOTE — Addendum Note (Signed)
Addended by: Gregor Hams on: 09/24/2018 06:49 AM   Modules accepted: Orders

## 2018-10-01 ENCOUNTER — Other Ambulatory Visit: Payer: Self-pay | Admitting: Family Medicine

## 2019-01-01 ENCOUNTER — Other Ambulatory Visit: Payer: Self-pay | Admitting: Family Medicine

## 2019-01-02 ENCOUNTER — Telehealth: Payer: Self-pay | Admitting: Family Medicine

## 2019-01-02 DIAGNOSIS — R718 Other abnormality of red blood cells: Secondary | ICD-10-CM

## 2019-01-02 DIAGNOSIS — R7989 Other specified abnormal findings of blood chemistry: Secondary | ICD-10-CM

## 2019-01-02 DIAGNOSIS — E291 Testicular hypofunction: Secondary | ICD-10-CM

## 2019-01-02 DIAGNOSIS — E782 Mixed hyperlipidemia: Secondary | ICD-10-CM

## 2019-01-02 NOTE — Telephone Encounter (Signed)
Patient is due for follow-up labs for his testosterone as well as thyroid. Please get labs fasting in the morning about midway between testosterone injections.  Additionally schedule video visit follow-up with me in the near future.  Labs have been called in.  Call for appointment at [phone]

## 2019-01-02 NOTE — Telephone Encounter (Signed)
Please call patient to schedule appt with Dr. Georgina Snell.

## 2019-01-04 ENCOUNTER — Ambulatory Visit: Payer: 59 | Admitting: Family Medicine

## 2019-01-04 ENCOUNTER — Encounter: Payer: Self-pay | Admitting: Family Medicine

## 2019-01-04 LAB — CBC
HCT: 53.3 % — ABNORMAL HIGH (ref 38.5–50.0)
Hemoglobin: 18.1 g/dL — ABNORMAL HIGH (ref 13.2–17.1)
MCH: 32.1 pg (ref 27.0–33.0)
MCHC: 34 g/dL (ref 32.0–36.0)
MCV: 94.7 fL (ref 80.0–100.0)
MPV: 9.7 fL (ref 7.5–12.5)
Platelets: 198 10*3/uL (ref 140–400)
RBC: 5.63 10*6/uL (ref 4.20–5.80)
RDW: 12.8 % (ref 11.0–15.0)
WBC: 5.8 10*3/uL (ref 3.8–10.8)

## 2019-01-04 LAB — COMPLETE METABOLIC PANEL WITH GFR
AG Ratio: 1.9 (calc) (ref 1.0–2.5)
ALT: 26 U/L (ref 9–46)
AST: 30 U/L (ref 10–40)
Albumin: 4.3 g/dL (ref 3.6–5.1)
Alkaline phosphatase (APISO): 70 U/L (ref 36–130)
BUN: 14 mg/dL (ref 7–25)
CO2: 26 mmol/L (ref 20–32)
Calcium: 9.2 mg/dL (ref 8.6–10.3)
Chloride: 102 mmol/L (ref 98–110)
Creat: 1.33 mg/dL (ref 0.60–1.35)
GFR, Est African American: 76 mL/min/{1.73_m2} (ref >=60)
GFR, Est Non African American: 65 mL/min/{1.73_m2} (ref >=60)
Globulin: 2.3 g/dL (calc) (ref 1.9–3.7)
Glucose, Bld: 85 mg/dL (ref 65–99)
Potassium: 4.2 mmol/L (ref 3.5–5.3)
Sodium: 137 mmol/L (ref 135–146)
Total Bilirubin: 0.7 mg/dL (ref 0.2–1.2)
Total Protein: 6.6 g/dL (ref 6.1–8.1)

## 2019-01-04 LAB — PSA: PSA: 0.8 ng/mL (ref <=4.0)

## 2019-01-04 LAB — TSH: TSH: 10.12 mIU/L — ABNORMAL HIGH (ref 0.40–4.50)

## 2019-01-04 LAB — TESTOSTERONE: Testosterone: 1054 ng/dL — ABNORMAL HIGH (ref 250–827)

## 2019-01-09 ENCOUNTER — Ambulatory Visit (INDEPENDENT_AMBULATORY_CARE_PROVIDER_SITE_OTHER): Payer: 59 | Admitting: Family Medicine

## 2019-01-09 ENCOUNTER — Encounter: Payer: Self-pay | Admitting: Family Medicine

## 2019-01-09 VITALS — BP 152/93 | HR 95 | Temp 98.5°F | Wt 197.0 lb

## 2019-01-09 DIAGNOSIS — E291 Testicular hypofunction: Secondary | ICD-10-CM | POA: Diagnosis not present

## 2019-01-09 DIAGNOSIS — E782 Mixed hyperlipidemia: Secondary | ICD-10-CM

## 2019-01-09 DIAGNOSIS — E039 Hypothyroidism, unspecified: Secondary | ICD-10-CM

## 2019-01-09 DIAGNOSIS — Z0000 Encounter for general adult medical examination without abnormal findings: Secondary | ICD-10-CM | POA: Diagnosis not present

## 2019-01-09 DIAGNOSIS — R718 Other abnormality of red blood cells: Secondary | ICD-10-CM

## 2019-01-09 DIAGNOSIS — R5383 Other fatigue: Secondary | ICD-10-CM

## 2019-01-09 MED ORDER — LORAZEPAM 0.5 MG PO TABS: 0.5000 mg | ORAL_TABLET | Freq: Every evening | ORAL | 0 refills | Status: DC | PRN

## 2019-01-09 MED ORDER — LEVOTHYROXINE SODIUM 75 MCG PO TABS: 75.0000 ug | ORAL_TABLET | Freq: Every day | ORAL | 1 refills | Status: DC

## 2019-01-09 MED ORDER — TESTOSTERONE CYPIONATE 200 MG/ML IM SOLN: 100.0000 mg | INTRAMUSCULAR | 0 refills | Status: DC

## 2019-01-09 MED ORDER — TRAZODONE HCL 50 MG PO TABS: 25.0000 mg | ORAL_TABLET | Freq: Every evening | ORAL | 3 refills | Status: AC | PRN

## 2019-01-09 NOTE — Patient Instructions (Signed)
Thank you for coming in today. Recheck in 3 months. We will get labs then.  Reduce testosterone to 100mg  every week.

## 2019-01-09 NOTE — Progress Notes (Signed)
Leonard Jackson is a 43 y.o. male who presents to [facility]: [facility] today for well adult. Leonard Jackson is doing reasonably well.  He had recent labs for testosterone monitoring his testosterone and hemoglobin are a bit elevated.  Additionally TSH was elevated but he notes that he had been out of his thyroid hormone for some time. Additionally he has been experiencing some fatigue and snoring.  He used his wife's CPAP machine and felt a lot better however his AHI on the machine was 21/h.  Thinks he may have sleep apnea and would like a sleep study.      ROS as above:  Exam:  BP (!) 152/93   Pulse 95   Temp 98.5 F (36.9 C) (Oral)   Wt 197 lb (89.4 kg)   BMI 24.62 kg/m   Wt Readings from Last 5 Encounters:  01/09/19 197 lb (89.4 kg)  05/21/18 199 lb (90.3 kg)  01/09/18 196 lb (88.9 kg)  07/14/17 190 lb (86.2 kg)  01/04/17 194 lb (88 kg)    Gen: Well NAD HEENT: EOMI,  MMM no goiter Lungs: Normal work of breathing. CTABL Heart: RRR no MRG Abd: NABS, Soft. Nondistended, Nontender Exts: Brisk capillary refill, warm and well perfused.   Lab and Radiology Results Recent Results (from the past 2160 hour(s))  CBC     Status: Abnormal   Collection Time: 01/03/19  8:39 AM  Result Value Ref Range   WBC 5.8 3.8 - 10.8 Thousand/uL   RBC 5.63 4.20 - 5.80 Million/uL   Hemoglobin 18.1 (H) 13.2 - 17.1 g/dL   HCT 53.3 (H) 38.5 - 50.0 %   MCV 94.7 80.0 - 100.0 fL   MCH 32.1 27.0 - 33.0 pg   MCHC 34.0 32.0 - 36.0 g/dL   RDW 12.8 11.0 - 15.0 %   Platelets 198 140 - 400 Thousand/uL   MPV 9.7 7.5 - 12.5 fL  COMPLETE METABOLIC PANEL WITH GFR     Status: None   Collection Time: 01/03/19  8:39 AM  Result Value Ref Range   Glucose, Bld 85 65 - 99 mg/dL    Comment: .            Fasting reference interval .    BUN 14 7 - 25 mg/dL   Creat 1.33 0.60 - 1.35 mg/dL   GFR, Est Non African  American 65 > OR = 60 mL/min/1.32m2   GFR, Est African American 76 > OR = 60 mL/min/1.69m2   BUN/Creatinine Ratio NOT APPLICABLE 6 - 22 (calc)   Sodium 137 135 - 146 mmol/L   Potassium 4.2 3.5 - 5.3 mmol/L   Chloride 102 98 - 110 mmol/L   CO2 26 20 - 32 mmol/L   Calcium 9.2 8.6 - 10.3 mg/dL   Total Protein 6.6 6.1 - 8.1 g/dL   Albumin 4.3 3.6 - 5.1 g/dL   Globulin 2.3 1.9 - 3.7 g/dL (calc)   AG Ratio 1.9 1.0 - 2.5 (calc)   Total Bilirubin 0.7 0.2 - 1.2 mg/dL   Alkaline phosphatase (APISO) 70 36 - 130 U/L   AST 30 10 - 40 U/L   ALT 26 9 - 46 U/L  TSH     Status: Abnormal   Collection Time: 01/03/19  8:39 AM  Result Value Ref Range   TSH 10.12 (H) 0.40 - 4.50 mIU/L  Testosterone     Status: Abnormal   Collection Time: 01/03/19  8:39 AM  Result Value Ref Range   Testosterone 1,054 (H) 250 - 827 ng/dL  PSA     Status: None   Collection Time: 01/03/19  8:39 AM  Result Value Ref Range   PSA 0.8 < OR = 4.0 ng/mL    Comment: The total PSA value from this assay system is  standardized against the WHO standard. The test  result will be approximately 20% lower when compared  to the equimolar-standardized total PSA (Beckman  Coulter). Comparison of serial PSA results should be  interpreted with this fact in mind. . This test was performed using the Siemens  chemiluminescent method. Values obtained from  different assay methods cannot be used interchangeably. PSA levels, regardless of value, should not be interpreted as absolute evidence of the presence or absence of disease.       Assessment and Plan: 43 y.o. male with  Well adult.  Doing reasonably well.  Plan to restart thyroid medication.  Additionally will arrange for sleep study to evaluate for sleep apnea.  Will decrease testosterone to 100 mg every week and recheck labs and 6 to 8 weeks.  PDMP reviewed during this encounter. Orders Placed This Encounter  Procedures  . Home sleep test    Standing Status:   Future     Standing Expiration Date:   01/09/2020    Order Specific Question:   Where should this test be performed:    Answer:   Other   Meds ordered this encounter  Medications  . levothyroxine (SYNTHROID) 75 MCG tablet    Sig: Take 1 tablet (75 mcg total) by mouth daily.    Dispense:  90 tablet    Refill:  1  . traZODone (DESYREL) 50 MG tablet    Sig: Take 0.5-1 tablets (25-50 mg total) by mouth at bedtime as needed for sleep.    Dispense:  90 tablet    Refill:  3  . testosterone cypionate (DEPOTESTOSTERONE CYPIONATE) 200 MG/ML injection    Sig: Inject 0.5 mLs (100 mg total) into the muscle once a week.    Dispense:  12 mL    Refill:  0    Pt will have to discard 1/2 the vial each week.  Marland Kitchen LORazepam (ATIVAN) 0.5 MG tablet    Sig: Take 1 tablet (0.5 mg total) by mouth at bedtime as needed for anxiety or sleep.    Dispense:  30 tablet    Refill:  0     Historical information moved to improve visibility of documentation.  Past Medical History:  Diagnosis Date  . DVT (deep venous thrombosis) ([place]) 06/21/2016  . Hypertriglyceridemia 03/05/2014  . Hypogonadism in male 03/06/2014  . Hypothyroidism (acquired) 05/21/2018  . 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  Stage 4a   Past Surgical History:  Procedure Laterality Date  . LYMPH NODE DISSECTION Left    Neck  . TONSILLECTOMY     Social History   Tobacco Use  . Smoking status: Former Smoker    Quit date: 05/12/2017    Years since quitting: 1.6  . Smokeless tobacco: Never Used  Substance Use Topics  . Alcohol use: Yes    Alcohol/week: 6.0 standard drinks    Types: 6 Standard drinks or equivalent per week   family history is not on file.  Medications: Current Outpatient Medications  Medication Sig Dispense Refill  . ADDERALL XR 15 MG 24 hr capsule TAKE 1 CAPSULE BY MOUTH DAILY.  MAY BE FILLED 60 DAYS AFTER DATE PRESCRIBED    . cyclobenzaprine (FLEXERIL) 10 MG tablet Take 1  tablet (10 mg total) by mouth 3 (three) times daily as needed for muscle spasms. 30 tablet 0  . levothyroxine (SYNTHROID) 75 MCG tablet Take 1 tablet (75 mcg total) by mouth daily. 90 tablet 1  . LORazepam (ATIVAN) 0.5 MG tablet Take 1 tablet (0.5 mg total) by mouth at bedtime as needed for anxiety or sleep. 30 tablet 0  . testosterone cypionate (DEPOTESTOSTERONE CYPIONATE) 200 MG/ML injection Inject 0.5 mLs (100 mg total) into the muscle once a week. 12 mL 0  . traZODone (DESYREL) 50 MG tablet Take 0.5-1 tablets (25-50 mg total) by mouth at bedtime as needed for sleep. 90 tablet 3   No current facility-administered medications for this visit.    No Known Allergies   Discussed warning signs or symptoms. Please see discharge instructions. Patient expresses understanding.

## 2019-03-08 ENCOUNTER — Telehealth: Payer: Self-pay | Admitting: Family Medicine

## 2019-03-08 DIAGNOSIS — R718 Other abnormality of red blood cells: Secondary | ICD-10-CM

## 2019-03-08 DIAGNOSIS — E291 Testicular hypofunction: Secondary | ICD-10-CM

## 2019-03-08 DIAGNOSIS — E039 Hypothyroidism, unspecified: Secondary | ICD-10-CM

## 2019-03-08 NOTE — Telephone Encounter (Signed)
Please get fasting testosterone and thyroid labs done in between testosterone doses.  We changed doses of medication in response to elevated testosterone and abnormal thyroid hormone levels about 2 months ago. Please get labs done fasting in between doses of testosterone in near future.

## 2019-03-08 NOTE — Telephone Encounter (Signed)
Pt reviewed providers note on MyChart

## 2019-03-08 NOTE — Telephone Encounter (Signed)
-----   Message from Gregor Hams, MD sent at 01/09/2019 12:37 PM EDT ----- Regarding: Recheck labs in 6 to 8 weeks. Recheck thyroid and testosterone labs 6 to 8 weeks after changing dose.

## 2019-03-16 LAB — CBC
HCT: 53.4 % — ABNORMAL HIGH (ref 38.5–50.0)
Hemoglobin: 18.3 g/dL — ABNORMAL HIGH (ref 13.2–17.1)
MCH: 31.6 pg (ref 27.0–33.0)
MCHC: 34.3 g/dL (ref 32.0–36.0)
MCV: 92.1 fL (ref 80.0–100.0)
MPV: 9.7 fL (ref 7.5–12.5)
Platelets: 239 10*3/uL (ref 140–400)
RBC: 5.8 10*6/uL (ref 4.20–5.80)
RDW: 12 % (ref 11.0–15.0)
WBC: 5.3 10*3/uL (ref 3.8–10.8)

## 2019-03-16 LAB — T4, FREE: Free T4: 1 ng/dL (ref 0.8–1.8)

## 2019-03-16 LAB — TSH: TSH: 5.36 mIU/L — ABNORMAL HIGH (ref 0.40–4.50)

## 2019-03-16 LAB — TESTOSTERONE: Testosterone: 424 ng/dL (ref 250–827)

## 2019-04-11 ENCOUNTER — Encounter: Payer: Self-pay | Admitting: Family Medicine

## 2019-04-11 ENCOUNTER — Ambulatory Visit: Payer: 59 | Admitting: Family Medicine

## 2019-04-11 ENCOUNTER — Other Ambulatory Visit: Payer: Self-pay

## 2019-04-11 VITALS — BP 130/86 | HR 82 | Temp 98.1°F | Wt 194.0 lb

## 2019-04-11 DIAGNOSIS — R718 Other abnormality of red blood cells: Secondary | ICD-10-CM

## 2019-04-11 DIAGNOSIS — E039 Hypothyroidism, unspecified: Secondary | ICD-10-CM

## 2019-04-11 DIAGNOSIS — G4700 Insomnia, unspecified: Secondary | ICD-10-CM | POA: Insufficient documentation

## 2019-04-11 DIAGNOSIS — M24851 Other specific joint derangements of right hip, not elsewhere classified: Secondary | ICD-10-CM

## 2019-04-11 DIAGNOSIS — M24852 Other specific joint derangements of left hip, not elsewhere classified: Secondary | ICD-10-CM

## 2019-04-11 DIAGNOSIS — E291 Testicular hypofunction: Secondary | ICD-10-CM

## 2019-04-11 DIAGNOSIS — F411 Generalized anxiety disorder: Secondary | ICD-10-CM

## 2019-04-11 DIAGNOSIS — F5101 Primary insomnia: Secondary | ICD-10-CM

## 2019-04-11 DIAGNOSIS — G4733 Obstructive sleep apnea (adult) (pediatric): Secondary | ICD-10-CM

## 2019-04-11 HISTORY — DX: Generalized anxiety disorder: F41.1

## 2019-04-11 MED ORDER — LORAZEPAM 0.5 MG PO TABS: 0.5000 mg | ORAL_TABLET | Freq: Every evening | ORAL | 5 refills | Status: DC | PRN

## 2019-04-11 NOTE — Progress Notes (Signed)
Leonard Jackson is a 43 y.o. male who presents to [facility] today for follow-up of hypothyroidism, follow-up possible sleep apnea, follow-up testosterone.  Patient had wellness visit 3 months ago on June 17.  At that time his TSH had been abnormal as he had been out of his thyroid hormone.  Levothyroxine 75 was restarted and TSH is rechecked on August 21 where it was still slightly elevated at 5.36.  He notes that he has missed a few doses and thinks that may explain his slightly higher TSH.  He feels fine.   Additionally his testosterone was a bit high when we checked it in June at 1054.  His testosterone was decreased to 100 mg every week and on recheck August 21 testosterone level was 421.  Hemoglobin was still elevated at 18.3.  He notes the current dose seems to be working quite well for him and is happy with how things are going.  Additionally he had been having fatigue when checked in June.  Concern for sleep apnea and home sleep test was ordered.  He notes that it was not done because of COVID-19 but he would like to pursue sleep study.  He used his wife's CPAP machine and notes that his AHI was 20.  Hip pain: Patient notes pain and a snapping sensation at his hips bilaterally.  This is mild and not particularly bothersome.  He is worried that it may reflect some more significant issue that will progress in the future.  He is able to exercise and feels well.  Anxiety: Patient notes that he has occasional anxiety.  This is quite well managed with very intermittent lorazepam.  He is he is 30 pills of lorazepam in the last 3 months.  He notes it helps when he is having a very anxious day or having trouble sleeping. ROS as above:  Exam:  BP 130/86   Pulse 82   Temp 98.1 F (36.7 C) (Oral)   Wt 194 lb (88 kg)   SpO2 100%   BMI 24.25 kg/m  Wt Readings from Last 5  Encounters:  04/11/19 194 lb (88 kg)  01/09/19 197 lb (89.4 kg)  05/21/18 199 lb (90.3 kg)  01/09/18 196 lb (88.9 kg)  07/14/17 190 lb (86.2 kg)    Gen: Well NAD HEENT: EOMI,  MMM Lungs: Normal work of breathing. CTABL Heart: RRR no MRG Abd: NABS, Soft. Nondistended, Nontender Exts: Brisk capillary refill, warm and well perfused.  Psych: Alert and oriented normal speech thought process and affect. MSK: Hips normal-appearing bilaterally.  Normal motion.  Nontender greater trochanter.  Hip abduction strength 4+/5 bilaterally.  Normal hip rotation strength and adduction strength.    Depression screen [facility] 2/9 04/11/2019 01/09/2018 07/14/2017 09/22/2016  Decreased Interest 0 0 0 0  Down, Depressed, Hopeless 0 0 0 0  PHQ - 2 Score 0 0 0 0  Altered sleeping 1 0 0 1  Tired, decreased energy 0 1 0 1  Change in appetite 1 0 0 1  Feeling bad or failure about yourself  0 0 0 0  Trouble concentrating 1 0 0 0  Moving slowly or fidgety/restless 0 0 0 0  Suicidal thoughts 0 0 0 0  PHQ-9 Score 3 1 0 3  Difficult doing work/chores Not difficult at all Not difficult at all - -   GAD 7 : Generalized Anxiety Score 04/11/2019 01/09/2018 09/22/2016  Nervous, Anxious, on Edge 1 1  0  Control/stop worrying 0 0 0  Worry too much - different things 0 0 0  Trouble relaxing 1 0 0  Restless 0 0 0  Easily annoyed or irritable 0 1 0  Afraid - awful might happen 0 0 0  Total GAD 7 Score 2 2 0  Anxiety Difficulty Not difficult at all Not difficult at all Not difficult at all      Lab and Radiology Results Recent Results (from the past 2160 hour(s))  TSH     Status: Abnormal   Collection Time: 03/15/19 11:39 AM  Result Value Ref Range   TSH 5.36 (H) 0.40 - 4.50 mIU/L  T4, free     Status: None   Collection Time: 03/15/19 11:39 AM  Result Value Ref Range   Free T4 1.0 0.8 - 1.8 ng/dL  Testosterone     Status: None   Collection Time: 03/15/19 11:39 AM  Result Value Ref Range   Testosterone 424 250 - 827  ng/dL  CBC     Status: Abnormal   Collection Time: 03/15/19 11:39 AM  Result Value Ref Range   WBC 5.3 3.8 - 10.8 Thousand/uL   RBC 5.80 4.20 - 5.80 Million/uL   Hemoglobin 18.3 (H) 13.2 - 17.1 g/dL   HCT 53.4 (H) 38.5 - 50.0 %   MCV 92.1 80.0 - 100.0 fL   MCH 31.6 27.0 - 33.0 pg   MCHC 34.3 32.0 - 36.0 g/dL   RDW 12.0 11.0 - 15.0 %   Platelets 239 140 - 400 Thousand/uL   MPV 9.7 7.5 - 12.5 fL      Assessment and Plan: 43 y.o. male with  Low testosterone: Clinically doing quite well.  Continue current regimen.  Address elevated hemoglobin below.  Hypothyroid: Recheck TSH near future.  If still elevated will adjust dose.  Elevated hemoglobin: Testosterone dose is appropriate however hemoglobin is still elevated.  I think he probably has uncontrolled sleep apnea and that is what is causing his elevated hemoglobin.  Plan to address sleep apnea as below.  Possible sleep apnea: Plan to proceed with home sleep study.  If he used his wife CPAP machine and his AHI was 20 he probably has moderate to severe obstructive sleep apnea.  This could certainly explain elevated hemoglobin.  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  Plan to proceed with home exercise program.  Stretching and strengthening protocol reviewed.  Recheck as needed.  Anxiety: Doing reasonably well.  Refill Lorazepam.  I informed patient that I am transitioning to sports medicine only [facility] in [place] starting in November.  Happy to see patient for continued sports medicine needs.  Discussed need for new PCP.  Provided some recommendations.   PDMP not reviewed this encounter. Orders Placed This Encounter  Procedures  . TSH   Meds ordered this encounter  Medications  . LORazepam (ATIVAN) 0.5 MG tablet    Sig: Take 1 tablet (0.5 mg total) by mouth at bedtime as needed for anxiety or sleep.    Dispense:  30 tablet    Refill:  5      Historical information moved to improve visibility of documentation.  Past Medical History:  Diagnosis Date  . DVT (deep venous thrombosis) ([place]) 06/21/2016  . Hypertriglyceridemia 03/05/2014  . Hypogonadism in male 03/06/2014  . Hypothyroidism (acquired) 05/21/2018  . 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  Stage 4a   Past Surgical History:  Procedure Laterality Date  . LYMPH NODE DISSECTION Left    Neck  . TONSILLECTOMY     Social History   Tobacco Use  . Smoking status: Former Smoker    Quit date: 05/12/2017    Years since quitting: 1.9  . Smokeless tobacco: Never Used  Substance Use Topics  . Alcohol use: Yes    Alcohol/week: 6.0 standard drinks    Types: 6 Standard drinks or equivalent per week   family history is not on file.  Medications: Current Outpatient Medications  Medication Sig Dispense Refill  . ADDERALL XR 15 MG 24 hr capsule TAKE 1 CAPSULE BY MOUTH DAILY. MAY BE FILLED 60 DAYS AFTER DATE PRESCRIBED    . cyclobenzaprine (FLEXERIL) 10 MG tablet Take 1 tablet (10 mg total) by mouth 3 (three) times daily as needed for muscle spasms. 30 tablet 0  . levothyroxine (SYNTHROID) 75 MCG tablet Take 1 tablet (75 mcg total) by mouth daily. 90 tablet 1  . LORazepam (ATIVAN) 0.5 MG tablet Take 1 tablet (0.5 mg total) by mouth at bedtime as needed for anxiety or sleep. 30 tablet 5  . testosterone cypionate (DEPOTESTOSTERONE CYPIONATE) 200 MG/ML injection Inject 0.5 mLs (100 mg total) into the muscle once a week. 12 mL 0  . traZODone (DESYREL) 50 MG tablet Take 0.5-1 tablets (25-50 mg total) by mouth at bedtime as needed for sleep. 90 tablet 3   No current facility-administered medications for this visit.    No Known Allergies   Discussed warning signs or symptoms. Please see discharge instructions. Patient expresses understanding.

## 2019-04-11 NOTE — Patient Instructions (Signed)
Thank you for coming in today. Let me know if you do not hear about sleep study.  Check TSH in early October.  Recheck in clinic in 6 months or sooner if needed.  For hip issue I think it is hip abductor tendonitis or external snapping hip syndrome.  Work on standing IT band stretch and Step exercises.  Remember to go down slowly and keep your centered over you foot.  Front step, Side Step  Cross over step 30 reps 2-3x daily  Recheck with me as needed.   For sports issues happy to see you in new clinic.  For primary care we will make sure you get good quality care.

## 2019-04-28 ENCOUNTER — Other Ambulatory Visit: Payer: Self-pay | Admitting: Family Medicine

## 2019-04-29 NOTE — Telephone Encounter (Signed)
Will forward to provider for review.

## 2019-05-06 LAB — TSH: TSH: 9.19 mIU/L — ABNORMAL HIGH (ref 0.40–4.50)

## 2019-05-07 ENCOUNTER — Encounter: Payer: Self-pay | Admitting: Family Medicine

## 2019-05-07 MED ORDER — LEVOTHYROXINE SODIUM 88 MCG PO TABS: 88.0000 ug | ORAL_TABLET | Freq: Every day | ORAL | 1 refills | Status: DC

## 2019-05-07 NOTE — Addendum Note (Signed)
Addended by: Gregor Hams on: 05/07/2019 06:19 AM   Modules accepted: Orders

## 2019-06-18 ENCOUNTER — Telehealth: Payer: Self-pay

## 2019-06-18 DIAGNOSIS — E039 Hypothyroidism, unspecified: Secondary | ICD-10-CM

## 2019-06-18 NOTE — Telephone Encounter (Signed)
Left a message asking patient to return call. He is due for a TSH. Will need to order under the covering provider.

## 2019-06-18 NOTE — Telephone Encounter (Signed)
-----   Message from Gregor Hams, MD sent at 05/07/2019  6:19 AM EDT ----- Regarding: Recheck TSH 6 weeks after adjusting levothyroxine dose Levothyroxine dose adjusted from 75 mcg to 88 mcg on October 13.  Plan to recheck TSH 6 weeks after adjusting dose. Please order TSH and contact patient to advise him to get nonfasting labs.  This reminder note was generated on October 13 and program to be sent on November 24.  Ellard Artis

## 2019-07-10 NOTE — Telephone Encounter (Signed)
Patient stopped by and was needing a new order for TSH labs. Labs placed and blake gave to the patient.

## 2019-07-11 LAB — TSH: TSH: 2.21 mIU/L (ref 0.40–4.50)

## 2019-07-20 ENCOUNTER — Other Ambulatory Visit: Payer: Self-pay | Admitting: Family Medicine

## 2019-10-21 ENCOUNTER — Other Ambulatory Visit: Payer: Self-pay | Admitting: Sports Medicine

## 2019-10-21 ENCOUNTER — Other Ambulatory Visit: Payer: Self-pay | Admitting: Family Medicine

## 2019-10-22 NOTE — Telephone Encounter (Signed)
Last appt with Dr. Georgina Snell 04/11/2019. Last filled 04/11/2019 #30 with 5 refills.  Has not established care with new provider.

## 2019-10-25 ENCOUNTER — Other Ambulatory Visit: Payer: Self-pay | Admitting: Family Medicine

## 2019-10-25 DIAGNOSIS — E039 Hypothyroidism, unspecified: Secondary | ICD-10-CM

## 2019-10-30 ENCOUNTER — Telehealth: Payer: Self-pay

## 2019-10-30 NOTE — Telephone Encounter (Signed)
Please call pt to schedule appt.  No further refills until pt is seen.  T. Escher Harr, CMA  

## 2019-10-30 NOTE — Telephone Encounter (Signed)
Appt made with Dr. Zigmund Daniel for refills and blood work

## 2019-11-06 ENCOUNTER — Ambulatory Visit (INDEPENDENT_AMBULATORY_CARE_PROVIDER_SITE_OTHER): Payer: 59 | Admitting: Family Medicine

## 2019-11-06 ENCOUNTER — Encounter: Payer: Self-pay | Admitting: Family Medicine

## 2019-11-06 ENCOUNTER — Other Ambulatory Visit: Payer: Self-pay

## 2019-11-06 DIAGNOSIS — F411 Generalized anxiety disorder: Secondary | ICD-10-CM

## 2019-11-06 DIAGNOSIS — E291 Testicular hypofunction: Secondary | ICD-10-CM | POA: Diagnosis not present

## 2019-11-06 DIAGNOSIS — E039 Hypothyroidism, unspecified: Secondary | ICD-10-CM

## 2019-11-06 MED ORDER — TESTOSTERONE CYPIONATE 200 MG/ML IM SOLN: INTRAMUSCULAR | 2 refills | Status: DC

## 2019-11-06 MED ORDER — LORAZEPAM 0.5 MG PO TABS: 0.5000 mg | ORAL_TABLET | Freq: Every evening | ORAL | 5 refills | Status: DC | PRN

## 2019-11-06 MED ORDER — LEVOTHYROXINE SODIUM 88 MCG PO TABS: 88.0000 ug | ORAL_TABLET | Freq: Every day | ORAL | 4 refills | Status: DC

## 2019-11-06 NOTE — Assessment & Plan Note (Signed)
Doing well with current dosing of testosterone.  Update labs at upcoming annual exam.

## 2019-11-06 NOTE — Assessment & Plan Note (Signed)
Last TSH stable with current dosing of levothyroxine.  Continue at current dose and will update TSH at upcoming annual exam.

## 2019-11-06 NOTE — Assessment & Plan Note (Signed)
Using lorazepam as needed, not using very often.  Will continue at current strength.  Rx renewed.

## 2019-11-06 NOTE — Patient Instructions (Signed)
Great to meet you! I have renewed your medications.  Let's plan to f/u in a few months for you annual exam.

## 2019-11-06 NOTE — Progress Notes (Signed)
Leonard Jackson - 44 y.o. male MRN [id number]  Date of birth: 1975/08/09  Subjective Chief Complaint  Patient presents with  . Follow-up    HPI Leonard Jackson is a 44 y.o. male with history of low testosterone, hypothyroidism, ADHD, GAD, and insomnia here today for follow up visit.    -Low testosterone:  Current treatment with testosterone injections, 150mg  weekly.  He reports that he is doing well with current dosing.     -GAD:  Current managed with lorazepam, uses rarely.  Last fill of #30 has lasted him about 4 months.  This works well for him.  He does also use trazodone at bedtime for associated insomnia.  This is working well for hime  -Hypothyroidism:  Last TSH normal with current dosing of levothyroxine at 40mcg.  He denies symptoms of hypo/hyper-thyroidism.   ROS:  A comprehensive ROS was completed and negative except as noted per HPI  No Known Allergies  Past Medical History:  Diagnosis Date  . DVT (deep venous thrombosis) ([place]) 06/21/2016  . GAD (generalized anxiety disorder) 04/11/2019  . Hypertriglyceridemia 03/05/2014  . Hypogonadism in male 03/06/2014  . Hypothyroidism (acquired) 05/21/2018  . 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  Stage 4a    Past Surgical History:  Procedure Laterality Date  . LYMPH NODE DISSECTION Left    Neck  . TONSILLECTOMY      Social History   Socioeconomic History  . Marital status: Married    Spouse name: Not on file  . Number of children: Not on file  . Years of education: Not on file  . Highest education level: Not on file  Occupational History  . Not on file  Tobacco Use  . Smoking status: Former Smoker    Quit date: 05/12/2017    Years since quitting: 2.4  . Smokeless tobacco: Never Used  Substance and Sexual Activity  . Alcohol use: Yes    Alcohol/week: 6.0 standard drinks    Types: 6 Standard drinks or equivalent per week  . Drug use: No  . Sexual activity: Yes     Partners: Female  Other Topics Concern  . Not on file  Social History Narrative  . Not on file   Social Determinants of Health   Financial Resource Strain:   . Difficulty of Paying Living Expenses:   Food Insecurity:   . Worried About Charity fundraiser in the Last Year:   . Arboriculturist in the Last Year:   Transportation Needs:   . Film/video editor (Medical):   Marland Kitchen Lack of Transportation (Non-Medical):   Physical Activity:   . Days of Exercise per Week:   . Minutes of Exercise per Session:   Stress:   . Feeling of Stress :   Social Connections:   . Frequency of Communication with Friends and Family:   . Frequency of Social Gatherings with Friends and Family:   . Attends Religious Services:   . Active Member of Clubs or Organizations:   . Attends Archivist Meetings:   Marland Kitchen Marital Status:     No family history on file.  Health Maintenance  Topic Date Due  . HIV Screening  Never done  . INFLUENZA VACCINE  02/23/2020  . TETANUS/TDAP  03/05/2024     ----------------------------------------------------------------------------------------------------------------------------------------------------------------------------------------------------------------- Physical Exam BP (!) 140/95   Pulse 81   Temp 97.9 F (36.6 C) (Oral)   Ht 6\' 3"  (1.905  m)   Wt 205 lb (93 kg)   BMI 25.62 kg/m   Physical Exam Constitutional:      Appearance: Normal appearance.  HENT:     Head: Normocephalic and atraumatic.  Eyes:     General: No scleral icterus. Cardiovascular:     Rate and Rhythm: Normal rate and regular rhythm.  Musculoskeletal:        General: Normal range of motion.     Cervical back: Neck supple.  Skin:    General: Skin is warm and dry.  Neurological:     General: No focal deficit present.     Mental Status: He is alert.  Psychiatric:        Mood and Affect: Mood normal.        Behavior: Behavior normal.      ------------------------------------------------------------------------------------------------------------------------------------------------------------------------------------------------------------------- Assessment and Plan  Hypothyroidism (acquired) Last TSH stable with current dosing of levothyroxine.  Continue at current dose and will update TSH at upcoming annual exam.   Hypogonadism in male Doing well with current dosing of testosterone.  Update labs at upcoming annual exam.   GAD (generalized anxiety disorder) Using lorazepam as needed, not using very often.  Will continue at current strength.  Rx renewed.     Meds ordered this encounter  Medications  . levothyroxine (SYNTHROID) 88 MCG tablet    Sig: Take 1 tablet (88 mcg total) by mouth daily.    Dispense:  30 tablet    Refill:  4  . LORazepam (ATIVAN) 0.5 MG tablet    Sig: Take 1 tablet (0.5 mg total) by mouth at bedtime as needed for anxiety or sleep.    Dispense:  30 tablet    Refill:  5  . testosterone cypionate (DEPOTESTOSTERONE CYPIONATE) 200 MG/ML injection    Sig: INJECT 0.75 MLS INTO THE MUSCLE ONCE A WEEK    Dispense:  12 mL    Refill:  2    Not to exceed 5 additional fills before 10/27/2019    No follow-ups on file.    This visit occurred during the SARS-CoV-2 public health emergency.  Safety protocols were in place, including screening questions prior to the visit, additional usage of staff PPE, and extensive cleaning of exam room while observing appropriate contact time as indicated for disinfecting solutions.

## 2019-12-18 ENCOUNTER — Other Ambulatory Visit: Payer: Self-pay | Admitting: Family Medicine

## 2019-12-18 DIAGNOSIS — E039 Hypothyroidism, unspecified: Secondary | ICD-10-CM

## 2019-12-18 NOTE — Telephone Encounter (Signed)
Please see message and advise.  Thank you. ° °

## 2019-12-26 ENCOUNTER — Encounter: Payer: Self-pay | Admitting: Family Medicine

## 2019-12-27 ENCOUNTER — Other Ambulatory Visit: Payer: Self-pay | Admitting: Family Medicine

## 2019-12-27 DIAGNOSIS — E291 Testicular hypofunction: Secondary | ICD-10-CM

## 2019-12-27 DIAGNOSIS — Z0000 Encounter for general adult medical examination without abnormal findings: Secondary | ICD-10-CM

## 2019-12-27 DIAGNOSIS — E782 Mixed hyperlipidemia: Secondary | ICD-10-CM

## 2019-12-27 DIAGNOSIS — E039 Hypothyroidism, unspecified: Secondary | ICD-10-CM

## 2019-12-30 ENCOUNTER — Encounter: Payer: 59 | Admitting: Family Medicine

## 2020-01-02 LAB — CBC
HCT: 54.1 % — ABNORMAL HIGH (ref 38.5–50.0)
Hemoglobin: 18.6 g/dL — ABNORMAL HIGH (ref 13.2–17.1)
MCH: 31.8 pg (ref 27.0–33.0)
MCHC: 34.4 g/dL (ref 32.0–36.0)
MCV: 92.5 fL (ref 80.0–100.0)
MPV: 9.7 fL (ref 7.5–12.5)
Platelets: 175 10*3/uL (ref 140–400)
RBC: 5.85 10*6/uL — ABNORMAL HIGH (ref 4.20–5.80)
RDW: 12.2 % (ref 11.0–15.0)
WBC: 6.4 10*3/uL (ref 3.8–10.8)

## 2020-01-02 LAB — COMPLETE METABOLIC PANEL WITH GFR
AG Ratio: 1.8 (calc) (ref 1.0–2.5)
ALT: 43 U/L (ref 9–46)
AST: 35 U/L (ref 10–40)
Albumin: 4.6 g/dL (ref 3.6–5.1)
Alkaline phosphatase (APISO): 73 U/L (ref 36–130)
BUN/Creatinine Ratio: 12 (calc) (ref 6–22)
BUN: 17 mg/dL (ref 7–25)
CO2: 29 mmol/L (ref 20–32)
Calcium: 9.8 mg/dL (ref 8.6–10.3)
Chloride: 99 mmol/L (ref 98–110)
Creat: 1.37 mg/dL — ABNORMAL HIGH (ref 0.60–1.35)
GFR, Est African American: 73 mL/min/{1.73_m2} (ref >=60)
GFR, Est Non African American: 63 mL/min/{1.73_m2} (ref >=60)
Globulin: 2.5 g/dL (calc) (ref 1.9–3.7)
Glucose, Bld: 91 mg/dL (ref 65–99)
Potassium: 4.3 mmol/L (ref 3.5–5.3)
Sodium: 137 mmol/L (ref 135–146)
Total Bilirubin: 1.3 mg/dL — ABNORMAL HIGH (ref 0.2–1.2)
Total Protein: 7.1 g/dL (ref 6.1–8.1)

## 2020-01-02 LAB — LIPID PANEL W/REFLEX DIRECT LDL
Cholesterol: 240 mg/dL — ABNORMAL HIGH (ref <200)
HDL: 54 mg/dL (ref >=40)
LDL Cholesterol (Calc): 167 mg/dL (calc) — ABNORMAL HIGH
Non-HDL Cholesterol (Calc): 186 mg/dL (calc) — ABNORMAL HIGH (ref <130)
Total CHOL/HDL Ratio: 4.4 (calc) (ref <5.0)
Triglycerides: 88 mg/dL (ref <150)

## 2020-01-02 LAB — TESTOSTERONE: Testosterone: 382 ng/dL (ref 250–827)

## 2020-01-02 LAB — TSH: TSH: 6.79 mIU/L — ABNORMAL HIGH (ref 0.40–4.50)

## 2020-01-07 ENCOUNTER — Encounter: Payer: Self-pay | Admitting: Family Medicine

## 2020-01-07 ENCOUNTER — Other Ambulatory Visit: Payer: Self-pay

## 2020-01-07 ENCOUNTER — Ambulatory Visit (INDEPENDENT_AMBULATORY_CARE_PROVIDER_SITE_OTHER): Payer: 59 | Admitting: Family Medicine

## 2020-01-07 VITALS — BP 143/94 | HR 95 | Temp 97.8°F | Ht 75.0 in | Wt 202.4 lb

## 2020-01-07 DIAGNOSIS — E782 Mixed hyperlipidemia: Secondary | ICD-10-CM

## 2020-01-07 DIAGNOSIS — E039 Hypothyroidism, unspecified: Secondary | ICD-10-CM | POA: Diagnosis not present

## 2020-01-07 DIAGNOSIS — Z0000 Encounter for general adult medical examination without abnormal findings: Secondary | ICD-10-CM | POA: Diagnosis not present

## 2020-01-07 DIAGNOSIS — E291 Testicular hypofunction: Secondary | ICD-10-CM

## 2020-01-07 MED ORDER — LEVOTHYROXINE SODIUM 100 MCG PO TABS: 88.0000 ug | ORAL_TABLET | Freq: Every day | ORAL | 1 refills | Status: DC

## 2020-01-07 NOTE — Patient Instructions (Signed)
Great to see you today! I would recommend donating blood to reduce your hemoglobin some.  I have increase the levothyroxine dose.  Stop in to have this checked in about 8 weeks.   Preventive Care 71-44 Years Old, Male Preventive care refers to lifestyle choices and visits with your health care provider that can promote health and wellness. This includes:  A yearly physical exam. This is also called an annual well check.  Regular dental and eye exams.  Immunizations.  Screening for certain conditions.  Healthy lifestyle choices, such as eating a healthy diet, getting regular exercise, not using drugs or products that contain nicotine and tobacco, and limiting alcohol use. What can I expect for my preventive care visit? Physical exam Your health care provider will check:  Height and weight. These may be used to calculate body mass index (BMI), which is a measurement that tells if you are at a healthy weight.  Heart rate and blood pressure.  Your skin for abnormal spots. Counseling Your health care provider may ask you questions about:  Alcohol, tobacco, and drug use.  Emotional well-being.  Home and relationship well-being.  Sexual activity.  Eating habits.  Work and work Statistician. What immunizations do I need?  Influenza (flu) vaccine  This is recommended every year. Tetanus, diphtheria, and pertussis (Tdap) vaccine  You may need a Td booster every 10 years. Varicella (chickenpox) vaccine  You may need this vaccine if you have not already been vaccinated. Zoster (shingles) vaccine  You may need this after age 67. Measles, mumps, and rubella (MMR) vaccine  You may need at least one dose of MMR if you were born in 1957 or later. You may also need a second dose. Pneumococcal conjugate (PCV13) vaccine  You may need this if you have certain conditions and were not previously vaccinated. Pneumococcal polysaccharide (PPSV23) vaccine  You may need one or two  doses if you smoke cigarettes or if you have certain conditions. Meningococcal conjugate (MenACWY) vaccine  You may need this if you have certain conditions. Hepatitis A vaccine  You may need this if you have certain conditions or if you travel or work in places where you may be exposed to hepatitis A. Hepatitis B vaccine  You may need this if you have certain conditions or if you travel or work in places where you may be exposed to hepatitis B. Haemophilus influenzae type b (Hib) vaccine  You may need this if you have certain risk factors. Human papillomavirus (HPV) vaccine  If recommended by your health care provider, you may need three doses over 6 months. You may receive vaccines as individual doses or as more than one vaccine together in one shot (combination vaccines). Talk with your health care provider about the risks and benefits of combination vaccines. What tests do I need? Blood tests  Lipid and cholesterol levels. These may be checked every 5 years, or more frequently if you are over 47 years old.  Hepatitis C test.  Hepatitis B test. Screening  Lung cancer screening. You may have this screening every year starting at age 34 if you have a 30-pack-year history of smoking and currently smoke or have quit within the past 15 years.  Prostate cancer screening. Recommendations will vary depending on your family history and other risks.  Colorectal cancer screening. All adults should have this screening starting at age 3 and continuing until age 2. Your health care provider may recommend screening at age 71 if you are at increased  risk. You will have tests every 1-10 years, depending on your results and the type of screening test.  Diabetes screening. This is done by checking your blood sugar (glucose) after you have not eaten for a while (fasting). You may have this done every 1-3 years.  Sexually transmitted disease (STD) testing. Follow these instructions at  home: Eating and drinking  Eat a diet that includes fresh fruits and vegetables, whole grains, lean protein, and low-fat dairy products.  Take vitamin and mineral supplements as recommended by your health care provider.  Do not drink alcohol if your health care provider tells you not to drink.  If you drink alcohol: ? Limit how much you have to 0-2 drinks a day. ? Be aware of how much alcohol is in your drink. In the [place] one drink equals one 12 oz bottle of beer (355 mL), one 5 oz glass of wine (148 mL), or one 1 oz glass of hard liquor (44 mL). Lifestyle  Take daily care of your teeth and gums.  Stay active. Exercise for at least 30 minutes on 5 or more days each week.  Do not use any products that contain nicotine or tobacco, such as cigarettes, e-cigarettes, and chewing tobacco. If you need help quitting, ask your health care provider.  If you are sexually active, practice safe sex. Use a condom or other form of protection to prevent STIs (sexually transmitted infections).  Talk with your health care provider about taking a low-dose aspirin every day starting at age 40. What's next?  Go to your health care provider once a year for a well check visit.  Ask your health care provider how often you should have your eyes and teeth checked.  Stay up to date on all vaccines. This information is not intended to replace advice given to you by your health care provider. Make sure you discuss any questions you have with your health care provider. Document Revised: 07/05/2018 Document Reviewed: 07/05/2018 Elsevier Patient Education  2020 [employer].

## 2020-01-07 NOTE — Assessment & Plan Note (Signed)
Lab Results  Component Value Date   LDLCALC 167 (H) 01/01/2020  Current ASCVD risk is 2.5%.  He will continue to work on dietary changes to improve this.

## 2020-01-07 NOTE — Assessment & Plan Note (Signed)
Well adult Recent labs reviewed and updates to medications made.  Immunizations:  Plans on getting COVID vaccine.  Screenings: UTD Anticipatory guidance/Risk factor reduction:  Recommendations per AVS

## 2020-01-07 NOTE — Assessment & Plan Note (Signed)
Testosterone levels look ok.  Hgb elevated, recommend donating blood to lower this.

## 2020-01-07 NOTE — Assessment & Plan Note (Signed)
Recent TSH elevated.  Increase levothyroxine to 140mcg.  Recheck in 8 weeks.

## 2020-01-07 NOTE — Progress Notes (Signed)
Leonard Jackson - 44 y.o. male MRN [id number]  Date of birth: 10-Jun-1976  Subjective Chief Complaint  Patient presents with   Annual Exam    HPI Leonard Jackson is a 44 y.o. male with history of hypothyroidism, hypogonadism, CKD and insomnia here today for annual exam.   He had labs completed prior to visit today.  I reviewed these with him today.  Hgb, LDL and TSH elevated.  Reports that he feels ok.  He has never had phlebotomy for elevated hgb.   He remains pretty active and exercise frequently.  He feels that he follows a pretty healthy diet.    He is a non-smoker and consumes EtOH in moderation.    Review of Systems  Constitutional: Negative for chills, fever, malaise/fatigue and weight loss.  HENT: Negative for congestion, ear pain and sore throat.   Eyes: Negative for blurred vision, double vision and pain.  Respiratory: Negative for cough and shortness of breath.   Cardiovascular: Negative for chest pain and palpitations.  Gastrointestinal: Negative for abdominal pain, blood in stool, constipation, heartburn and nausea.  Genitourinary: Negative for dysuria and urgency.  Musculoskeletal: Negative for joint pain and myalgias.  Neurological: Negative for dizziness and headaches.  Endo/Heme/Allergies: Does not bruise/bleed easily.  Psychiatric/Behavioral: Negative for depression. The patient is not nervous/anxious and does not have insomnia.       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  Stage 4a    Past Surgical History:  Procedure Laterality Date   LYMPH NODE DISSECTION Left    Neck   TONSILLECTOMY      Social History   Socioeconomic History    Marital status: Married    Spouse name: Not on file   Number of children: Not on file   Years of education: Not on file   Highest education level: Not on file  Occupational History   Not on file  Tobacco Use   Smoking status: Former Smoker    Quit date: 05/12/2017    Years since quitting: 2.6   Smokeless tobacco: Never Used  Substance and Sexual Activity   Alcohol use: Yes    Alcohol/week: 6.0 standard drinks    Types: 6 Standard drinks or equivalent per week   Drug use: No   Sexual activity: Yes    Partners: Female  Other Topics Concern   Not on file  Social History Narrative   Not on file   Social Determinants of Health   Financial Resource Strain:    Difficulty of Paying Living Expenses:   Food Insecurity:    Worried About Charity fundraiser in the Last Year:    Arboriculturist in the Last Year:   Transportation Needs:    Film/video editor (Medical):    Lack of Transportation (Non-Medical):   Physical Activity:    Days of Exercise per Week:    Minutes of Exercise per Session:   Stress:    Feeling of Stress :   Social Connections:    Frequency of Communication with Friends and Family:    Frequency of Social Gatherings with Friends and Family:    Attends Religious Services:    Active Member of Clubs  or Organizations:    Attends Music therapist:    Marital Status:     No family history on file.  Health Maintenance  Topic Date Due   Hepatitis C Screening  Never done   COVID-19 Vaccine (1) Never done   HIV Screening  Never done   INFLUENZA VACCINE  02/23/2020   TETANUS/TDAP  03/05/2024     ----------------------------------------------------------------------------------------------------------------------------------------------------------------------------------------------------------------- Physical Exam BP (!) 143/94 (BP Location: Left Arm, Patient Position: Sitting, Cuff Size: Normal)    Pulse 95     Temp 97.8 F (36.6 C) (Oral)    Ht 6\' 3"  (1.905 m)    Wt 202 lb 6.4 Leonard (91.8 kg)    SpO2 96%    BMI 25.30 kg/m   Physical Exam Constitutional:      General: He is not in acute distress. HENT:     Head: Normocephalic and atraumatic.     Right Ear: Tympanic membrane and external ear normal.     Left Ear: Tympanic membrane and external ear normal.  Eyes:     General: No scleral icterus. Neck:     Thyroid: No thyromegaly.  Cardiovascular:     Rate and Rhythm: Normal rate and regular rhythm.     Heart sounds: Normal heart sounds.  Pulmonary:     Effort: Pulmonary effort is normal.     Breath sounds: Normal breath sounds.  Abdominal:     General: Bowel sounds are normal. There is no distension.     Palpations: Abdomen is soft.     Tenderness: There is no abdominal tenderness. There is no guarding.  Musculoskeletal:     Cervical back: Normal range of motion.  Lymphadenopathy:     Cervical: No cervical adenopathy.  Skin:    General: Skin is warm and dry.     Findings: No rash.  Neurological:     Mental Status: He is alert and oriented to person, place, and time.     Cranial Nerves: No cranial nerve deficit.     Motor: No abnormal muscle tone.  Psychiatric:        Mood and Affect: Mood normal.        Behavior: Behavior normal.     ------------------------------------------------------------------------------------------------------------------------------------------------------------------------------------------------------------------- Assessment and Plan  Hypothyroidism (acquired) Recent TSH elevated.  Increase levothyroxine to 174mcg.  Recheck in 8 weeks.   Hypogonadism in male Testosterone levels look ok.  Hgb elevated, recommend donating blood to lower this.    Hyperlipidemia Lab Results  Component Value Date   LDLCALC 167 (H) 01/01/2020  Current ASCVD risk is 2.5%.  He will continue to work on dietary changes to improve this.   Well adult exam Well  adult Recent labs reviewed and updates to medications made.  Immunizations:  Plans on getting COVID vaccine.  Screenings: UTD Anticipatory guidance/Risk factor reduction:  Recommendations per AVS   Meds ordered this encounter  Medications   levothyroxine (SYNTHROID) 100 MCG tablet    Sig: Take 1 tablet (100 mcg total) by mouth daily.    Dispense:  90 tablet    Refill:  1    Return in about 6 months (around 07/08/2020) for hypothyroidism/Low testosterone.    This visit occurred during the SARS-CoV-2 public health emergency.  Safety protocols were in place, including screening questions prior to the visit, additional usage of staff PPE, and extensive cleaning of exam room while observing appropriate contact time as indicated for disinfecting solutions.

## 2020-05-22 ENCOUNTER — Encounter: Payer: Self-pay | Admitting: Family Medicine

## 2020-07-05 ENCOUNTER — Other Ambulatory Visit: Payer: Self-pay | Admitting: Family Medicine

## 2020-07-05 DIAGNOSIS — E039 Hypothyroidism, unspecified: Secondary | ICD-10-CM

## 2020-07-08 ENCOUNTER — Encounter: Payer: Self-pay | Admitting: Family Medicine

## 2020-07-08 ENCOUNTER — Ambulatory Visit (INDEPENDENT_AMBULATORY_CARE_PROVIDER_SITE_OTHER): Payer: 59 | Admitting: Family Medicine

## 2020-07-08 VITALS — BP 136/97 | HR 85 | Temp 97.6°F | Wt 197.0 lb

## 2020-07-08 DIAGNOSIS — R718 Other abnormality of red blood cells: Secondary | ICD-10-CM

## 2020-07-08 DIAGNOSIS — F5101 Primary insomnia: Secondary | ICD-10-CM

## 2020-07-08 DIAGNOSIS — F909 Attention-deficit hyperactivity disorder, unspecified type: Secondary | ICD-10-CM | POA: Insufficient documentation

## 2020-07-08 DIAGNOSIS — E039 Hypothyroidism, unspecified: Secondary | ICD-10-CM | POA: Diagnosis not present

## 2020-07-08 DIAGNOSIS — E291 Testicular hypofunction: Secondary | ICD-10-CM | POA: Diagnosis not present

## 2020-07-08 DIAGNOSIS — Z23 Encounter for immunization: Secondary | ICD-10-CM | POA: Diagnosis not present

## 2020-07-08 DIAGNOSIS — F900 Attention-deficit hyperactivity disorder, predominantly inattentive type: Secondary | ICD-10-CM

## 2020-07-08 MED ORDER — TESTOSTERONE CYPIONATE 200 MG/ML IM SOLN: INTRAMUSCULAR | 2 refills | Status: AC

## 2020-07-08 MED ORDER — LORAZEPAM 0.5 MG PO TABS: 0.5000 mg | ORAL_TABLET | Freq: Every evening | ORAL | 5 refills | Status: AC | PRN

## 2020-07-08 NOTE — Assessment & Plan Note (Signed)
Feels good at current dose of levothyroxine.  Update TSH 

## 2020-07-08 NOTE — Assessment & Plan Note (Signed)
Managed by outside mental health provider.

## 2020-07-08 NOTE — Assessment & Plan Note (Signed)
Well controlled with trazodone and lorazepam as needed.  Lorazepam renewed.

## 2020-07-08 NOTE — Assessment & Plan Note (Signed)
Doing well with current testosterone supplementation.  Update Testosterone, CBC and PSA today.

## 2020-07-08 NOTE — Progress Notes (Signed)
Leonard Jackson - 44 y.o. male MRN [id number]  Date of birth: [birth year]  Subjective No chief complaint on file.   HPI Leonard Jackson is a 44 y.o. male here today for follow up of hypogonadism, hypothyroidism, and anxiety with insomnia.    He is also seeing mental health provider for management of adderall.  Doing well with this.   He feels good a current dose of tesosterone and levothyroxine.  Denies symptoms of increased fatigue or changes to libido.    Sleeping well at this time.  He is taking trazodone andwill use lorazepam as needed.  This is working better for him than halcion  ROS:  A comprehensive ROS was completed and negative except as noted per HPI  No Known Allergies  Past Medical History:  Diagnosis Date  . DVT (deep venous thrombosis) ([place]) 06/21/2016  . GAD (generalized anxiety disorder) 04/11/2019  . Hypertriglyceridemia 03/05/2014  . Hypogonadism in male 03/06/2014  . Hypothyroidism (acquired) 05/21/2018  . Squamous cell carcinoma of base of tongue ([place]) 02/17/2016   Surg Aug 2017 s/p XRT and chem.  Stage 4a    Past Surgical History:  Procedure Laterality Date  . LYMPH NODE DISSECTION Left    Neck  . TONSILLECTOMY      Social History   Socioeconomic History  . Marital status: Married    Spouse name: Not on file  . Number of children: Not on file  . Years of education: Not on file  . Highest education level: Not on file  Occupational History  . Not on file  Tobacco Use  . Smoking status: Former Smoker    Quit date: 05/12/2017    Years since quitting: 3.1  . Smokeless tobacco: Never Used  Substance and Sexual Activity  . Alcohol use: Yes    Alcohol/week: 6.0 standard drinks    Types: 6 Standard drinks or equivalent per week  . Drug use: No  . Sexual activity: Yes    Partners: Female  Other Topics Concern  . Not on file  Social History Narrative  . Not on file   Social Determinants of Health   Financial Resource Strain: Not on file  Food  Insecurity: Not on file  Transportation Needs: Not on file  Physical Activity: Not on file  Stress: Not on file  Social Connections: Not on file    No family history on file.  Health Maintenance  Topic Date Due  . Hepatitis C Screening  Never done  . COVID-19 Vaccine (1) Never done  . HIV Screening  Never done  . TETANUS/TDAP  03/05/2024  . INFLUENZA VACCINE  Completed     ----------------------------------------------------------------------------------------------------------------------------------------------------------------------------------------------------------------- Physical Exam BP (!) 136/97 (BP Location: Left Arm, Patient Position: Sitting, Cuff Size: Normal)   Pulse 85   Temp 97.6 F (36.4 C)   Wt 197 lb (89.4 kg)   SpO2 100%   BMI 24.62 kg/m   Physical Exam Constitutional:      Appearance: Normal appearance.  HENT:     Head: Normocephalic and atraumatic.  Eyes:     General: No scleral icterus. Cardiovascular:     Rate and Rhythm: Normal rate and regular rhythm.  Pulmonary:     Effort: Pulmonary effort is normal.     Breath sounds: Normal breath sounds.  Neurological:     General: No focal deficit present.     Mental Status: He is alert.  Psychiatric:        Mood and Affect: Mood normal.  Behavior: Behavior normal.     ------------------------------------------------------------------------------------------------------------------------------------------------------------------------------------------------------------------- Assessment and Plan  Hypothyroidism (acquired) Feels good at current dose of levothyroxine.  Update TSH  Hypogonadism in male Doing well with current testosterone supplementation.  Update Testosterone, CBC and PSA today.   Insomnia Well controlled with trazodone and lorazepam as needed.  Lorazepam renewed.   ADHD Managed by outside mental health provider.    Meds ordered this encounter  Medications   . LORazepam (ATIVAN) 0.5 MG tablet    Sig: Take 1 tablet (0.5 mg total) by mouth at bedtime as needed for anxiety or sleep.    Dispense:  30 tablet    Refill:  5  . testosterone cypionate (DEPOTESTOSTERONE CYPIONATE) 200 MG/ML injection    Sig: INJECT 0.75 MLS INTO THE MUSCLE ONCE A WEEK    Dispense:  12 mL    Refill:  2    Not to exceed 5 additional fills before 10/27/2019    Return in about 6 months (around 01/06/2021) for Testosterone/Thyroid.    This visit occurred during the SARS-CoV-2 public health emergency.  Safety protocols were in place, including screening questions prior to the visit, additional usage of staff PPE, and extensive cleaning of exam room while observing appropriate contact time as indicated for disinfecting solutions.

## 2020-07-08 NOTE — Patient Instructions (Signed)
Great to see you today! Please have labs completed Let's plan to follow up in about 6 months.

## 2020-11-05 ENCOUNTER — Encounter: Payer: Self-pay | Admitting: Family Medicine

## 2021-01-07 ENCOUNTER — Encounter: Payer: 59 | Admitting: Family Medicine

## 2021-01-17 ENCOUNTER — Other Ambulatory Visit: Payer: Self-pay | Admitting: Family Medicine

## 2021-01-17 DIAGNOSIS — E039 Hypothyroidism, unspecified: Secondary | ICD-10-CM

## 2023-01-16 DIAGNOSIS — F19959 Other psychoactive substance use, unspecified with psychoactive substance-induced psychotic disorder, unspecified: Secondary | ICD-10-CM | POA: Insufficient documentation

## 2023-01-18 DIAGNOSIS — F19959 Other psychoactive substance use, unspecified with psychoactive substance-induced psychotic disorder, unspecified: Secondary | ICD-10-CM | POA: Insufficient documentation

## 2023-01-23 ENCOUNTER — Telehealth: Payer: Self-pay

## 2023-01-23 NOTE — Transitions of Care (Post Inpatient/ED Visit) (Signed)
   01/23/2023  Name: Leonard Jackson MRN: [id number] DOB: [birth year]  Today's TOC FU Call Status: Today's TOC FU Call Status:: Unsuccessul Call (1st Attempt) Unsuccessful Call (1st Attempt) Date: 01/23/23  Attempted to reach the patient regarding the most recent Inpatient/ED visit.  Follow Up Plan: Additional outreach attempts will be made to reach the patient to complete the Transitions of Care (Post Inpatient/ED visit) call.   Signature Karena Addison, LPN [facility] Nurse Health Advisor Direct Dial [phone]

## 2023-01-24 NOTE — Transitions of Care (Post Inpatient/ED Visit) (Signed)
   01/24/2023  Name: Ulas Metzker MRN: [id number] DOB: Sep 25, 1975  Today's TOC FU Call Status: Today's TOC FU Call Status:: Successful TOC FU Call Competed Unsuccessful Call (1st Attempt) Date: 01/23/23 [facility] FU Call Complete Date: 01/24/23  Transition Care Management Follow-up Telephone Call Date of Discharge: 01/19/23 Discharge Facility: Other [facility]) Name of Other (Non[facility]) Discharge Facility: [facility] Type of Discharge: Inpatient Admission How have you been since you were released from the hospital?: Better Any questions or concerns?: No  Items Reviewed: Did you receive and understand the discharge instructions provided?: No Medications obtained,verified, and reconciled?: Yes (Medications Reviewed) Any new allergies since your discharge?: No Dietary orders reviewed?: Yes Do you have support at home?: Yes People in Home: spouse Name of Support/Comfort Primary Source: abnormal labs  Medications Reviewed Today: Medications Reviewed Today     Reviewed by Karena Addison, LPN (Licensed Practical Nurse) on 01/24/23 at 1338  Med List Status: <None>   Medication Order Taking? Sig Documenting Provider Last Dose Status Informant  ADDERALL XR 15 MG 24 hr capsule [id number] No TAKE 1 CAPSULE BY MOUTH DAILY. MAY BE FILLED 60 DAYS AFTER DATE PRESCRIBED [provider] Taking Active   cyclobenzaprine (FLEXERIL) 10 MG tablet [id number] No Take 1 tablet (10 mg total) by mouth 3 (three) times daily as needed for muscle spasms. Rodolph Bong, MD Taking Active   levothyroxine (SYNTHROID) 100 MCG tablet [id number]  TAKE 1 TABLET BY MOUTH EVERY DAY Everrett Coombe, DO  Active   LORazepam (ATIVAN) 0.5 MG tablet [id number] No Take 1 tablet (0.5 mg total) by mouth at bedtime as needed for anxiety or sleep. Everrett Coombe, DO Taking Active   testosterone cypionate (DEPOTESTOSTERONE CYPIONATE) 200 MG/ML injection [id number] No INJECT 0.75 MLS INTO THE MUSCLE ONCE A WEEK  Everrett Coombe, DO Taking Active   traZODone (DESYREL) 50 MG tablet [id number] No Take 0.5-1 tablets (25-50 mg total) by mouth at bedtime as needed for sleep. Rodolph Bong, MD Taking Active   triazolam (HALCION) 0.25 MG tablet [id number] No Take 0.25 mg by mouth at bedtime as needed. [provider] Taking Active             Home Care and Equipment/Supplies: Were Home Health Services Ordered?: NA Any new equipment or medical supplies ordered?: NA  Functional Questionnaire: Do you need assistance with bathing/showering or dressing?: No Do you need assistance with meal preparation?: No Do you need assistance with eating?: No Do you have difficulty maintaining continence: No Do you need assistance with getting out of bed/getting out of a chair/moving?: No Do you have difficulty managing or taking your medications?: No  Follow up appointments reviewed: PCP Follow-up appointment confirmed?: Yes Date of PCP follow-up appointment?: 02/02/23 Follow-up Provider: [facility] Follow-up appointment confirmed?: NA Do you need transportation to your follow-up appointment?: No Do you understand care options if your condition(s) worsen?: Yes-patient verbalized understanding    SIGNATURE Karena Addison, LPN [facility] Nurse Health Advisor Direct Dial [phone]

## 2023-02-02 ENCOUNTER — Ambulatory Visit: Payer: 59 | Admitting: Family Medicine

## 2023-02-02 ENCOUNTER — Encounter: Payer: Self-pay | Admitting: Family Medicine

## 2023-02-02 VITALS — BP 142/96 | HR 90 | Ht 75.0 in | Wt 196.0 lb

## 2023-02-02 DIAGNOSIS — F900 Attention-deficit hyperactivity disorder, predominantly inattentive type: Secondary | ICD-10-CM | POA: Diagnosis not present

## 2023-02-02 DIAGNOSIS — E039 Hypothyroidism, unspecified: Secondary | ICD-10-CM | POA: Diagnosis not present

## 2023-02-02 DIAGNOSIS — F19959 Other psychoactive substance use, unspecified with psychoactive substance-induced psychotic disorder, unspecified: Secondary | ICD-10-CM | POA: Diagnosis not present

## 2023-02-02 DIAGNOSIS — E291 Testicular hypofunction: Secondary | ICD-10-CM

## 2023-02-03 ENCOUNTER — Other Ambulatory Visit: Payer: Self-pay | Admitting: Family Medicine

## 2023-02-03 DIAGNOSIS — E039 Hypothyroidism, unspecified: Secondary | ICD-10-CM

## 2023-02-03 LAB — TESTOSTERONE: Testosterone: 190 ng/dL — ABNORMAL LOW (ref 250–827)

## 2023-02-03 LAB — TSH: TSH: 15.78 mIU/L — ABNORMAL HIGH (ref 0.40–4.50)

## 2023-02-03 MED ORDER — LEVOTHYROXINE SODIUM 100 MCG PO TABS
100.0000 ug | ORAL_TABLET | Freq: Every day | ORAL | 1 refills | Status: AC
Start: 2023-02-03 — End: ?

## 2023-02-06 NOTE — Assessment & Plan Note (Signed)
He is off testosterone for a while now.  Has had some noticeable effects of being off of this.  Update testosterone levels.

## 2023-02-06 NOTE — Assessment & Plan Note (Signed)
He has been off of levothyroxine.  Update TSH.

## 2023-02-06 NOTE — Assessment & Plan Note (Signed)
Poss related to marijuana use and or overuse of his ADD medication.  Encouraged continued discussion with his psychiatrist that is managing his ADD medication.

## 2023-02-06 NOTE — Assessment & Plan Note (Signed)
Management psychiatry.  He will follow-up with [place] attention specialist.

## 2023-02-06 NOTE — Progress Notes (Signed)
Leonard Jackson - 47 y.o. male MRN [id number]  Date of birth: 10-29-1975  Subjective Chief Complaint  Patient presents with   Hospitalization Follow-up    HPI Leonard Jackson is a 47 year old male here today for follow-up of recent hospitalization.  Seen in the ED for alteration in mental status noted by his spouse.  According to ED notes patient's wife reported that he has been talking recently about being able to travel through time.  She returned home from work 1 day and found him an altered state.  He was laying on the floor, extremely agitated, dirty and sweaty.  He had cut off on the [place] under house was talking about how radiation was affecting him.  She is unsure if he had taken too much of his Adderall.  He does admit to using marijuana.  He was evaluated for any medical causes of his mental status change and psychosis.  Doing medically stable.  Psychiatry was consulted and felt that admission was needed for acute psychosis.  He was transferred to [facility] in [place] for inpatient psychiatry admission.  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  After a couple of days his mental status improved.   He does see psychiatry on outpatient basis for management of his ADHD.  Feels like he is returned back to baseline at this point.  His TSH was noted to be elevated in the hospital.  He has not been taking his levothyroxine in several months.    ROS:  A comprehensive ROS was completed and negative except as noted per HPI  No Known Allergies  Past Medical History:  Diagnosis Date   DVT (deep venous thrombosis) ([facility]) 06/21/2016   GAD (generalized anxiety disorder) 04/11/2019   Hypertriglyceridemia 03/05/2014   Hypogonadism in male 03/06/2014   Hypothyroidism (acquired) 05/21/2018   Squamous cell carcinoma of base of tongue ([facility]) 02/17/2016   Surg Aug 2017 s/p XRT and chem.  Stage 4a    Past Surgical History:  Procedure Laterality Date   LYMPH  NODE DISSECTION Left    Neck   TONSILLECTOMY      Social History   Socioeconomic History   Marital status: Married    Spouse name: Not on file   Number of children: Not on file   Years of education: Not on file   Highest education level: Not on file  Occupational History   Not on file  Tobacco Use   Smoking status: Former    Current packs/day: 0.00    Types: Cigarettes    Quit date: 05/12/2017    Years since quitting: 5.7   Smokeless tobacco: Never  Substance and Sexual Activity   Alcohol use: Yes    Alcohol/week: 6.0 standard drinks of alcohol    Types: 6 Standard drinks or equivalent per week   Drug use: No   Sexual activity: Yes    Partners: Female  Other Topics Concern   Not on file  Social History Narrative   Not on file   Social Determinants of Health   Financial Resource Strain: Not on file  Food Insecurity: No Food Insecurity (01/17/2023)   Received from [facility], [facility]   Hunger Vital Sign    Worried About Running Out of Food in the Last Year: Never true    Ran Out of Food in the Last Year: Never true  Transportation Needs: No Transportation Needs (01/17/2023)   [facility], [facility]   [facility] - Transportation  Lack of Transportation (Medical): No    Lack of Transportation (Non-Medical): No  Physical Activity: Not on file  Stress: No Stress Concern Present (01/17/2023)   Received from [facility], [facility] of Occupational [facility] - Occupational Stress Questionnaire    Feeling of Stress : Not at all  Social Connections: Unknown (01/15/2023)   Received from [facility], [facility]   Social Network    Social Network: Not on file    History reviewed. No pertinent family history.  Health Maintenance  Topic Date Due   HIV Screening  Never done   Hepatitis C Screening  Never done   COVID-19 Vaccine (3 - Moderna risk series) 03/03/2020   Colonoscopy  Never done   INFLUENZA VACCINE   02/23/2023   DTaP/Tdap/Td (2 - Td or Tdap) 03/05/2024   HPV VACCINES  Aged Out     ----------------------------------------------------------------------------------------------------------------------------------------------------------------------------------------------------------------- Physical Exam BP (!) 142/96   Pulse 90   Ht 6\' 3"  (1.905 m)   Wt 196 lb (88.9 kg)   SpO2 97%   BMI 24.50 kg/m   Physical Exam Constitutional:      Appearance: Normal appearance.  Cardiovascular:     Rate and Rhythm: Normal rate and regular rhythm.  Pulmonary:     Effort: Pulmonary effort is normal.     Breath sounds: Normal breath sounds.  Neurological:     Mental Status: He is alert.  Psychiatric:        Mood and Affect: Mood normal.        Behavior: Behavior normal.     ------------------------------------------------------------------------------------------------------------------------------------------------------------------------------------------------------------------- Assessment and Plan  Hypogonadism in male He is off testosterone for a while now.  Has had some noticeable effects of being off of this.  Update testosterone levels.  Hypothyroidism (acquired) He has been off of levothyroxine.  Update TSH.  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  Encouraged continued discussion with his psychiatrist that is managing his ADD medication.  ADHD Management psychiatry.  He will follow-up with [place] attention specialist.   No orders of the defined types were placed in this encounter.   No follow-ups on file.    This visit occurred during the SARS-CoV-2 public health emergency.  Safety protocols were in place, including screening questions prior to the visit, additional usage of staff PPE, and extensive cleaning of exam room while observing appropriate contact time as indicated for disinfecting  solutions.
